# Patient Record
Sex: Female | Born: 1980 | Race: White | Hispanic: No | Smoking: Current every day smoker
Health system: Southern US, Community
[De-identification: ages and names within clinical notes are randomized; demographics above are authoritative.]

## PROBLEM LIST (undated history)

## (undated) DIAGNOSIS — F1910 Other psychoactive substance abuse, uncomplicated: Secondary | ICD-10-CM

## (undated) DIAGNOSIS — M5126 Other intervertebral disc displacement, lumbar region: Secondary | ICD-10-CM

---

## 1998-01-05 ENCOUNTER — Emergency Department (HOSPITAL_COMMUNITY): Admission: EM | Admit: 1998-01-05 | Discharge: 1998-01-05 | Payer: Self-pay | Admitting: Emergency Medicine

## 1998-01-05 ENCOUNTER — Encounter: Payer: Self-pay | Admitting: Emergency Medicine

## 1998-04-25 ENCOUNTER — Emergency Department (HOSPITAL_COMMUNITY): Admission: EM | Admit: 1998-04-25 | Discharge: 1998-04-25 | Payer: Self-pay

## 1998-06-01 ENCOUNTER — Emergency Department (HOSPITAL_COMMUNITY): Admission: EM | Admit: 1998-06-01 | Discharge: 1998-06-01 | Payer: Self-pay | Admitting: Emergency Medicine

## 1998-06-01 ENCOUNTER — Encounter: Payer: Self-pay | Admitting: Emergency Medicine

## 1998-07-31 ENCOUNTER — Emergency Department (HOSPITAL_COMMUNITY): Admission: EM | Admit: 1998-07-31 | Discharge: 1998-07-31 | Payer: Self-pay | Admitting: Emergency Medicine

## 1999-01-08 ENCOUNTER — Emergency Department (HOSPITAL_COMMUNITY): Admission: EM | Admit: 1999-01-08 | Discharge: 1999-01-08 | Payer: Self-pay | Admitting: Emergency Medicine

## 1999-03-30 ENCOUNTER — Emergency Department (HOSPITAL_COMMUNITY): Admission: EM | Admit: 1999-03-30 | Discharge: 1999-03-30 | Payer: Self-pay

## 1999-05-23 ENCOUNTER — Encounter: Payer: Self-pay | Admitting: Emergency Medicine

## 1999-05-23 ENCOUNTER — Emergency Department (HOSPITAL_COMMUNITY): Admission: EM | Admit: 1999-05-23 | Discharge: 1999-05-23 | Payer: Self-pay | Admitting: Emergency Medicine

## 1999-12-28 ENCOUNTER — Emergency Department (HOSPITAL_COMMUNITY): Admission: EM | Admit: 1999-12-28 | Discharge: 1999-12-28 | Payer: Self-pay | Admitting: *Deleted

## 2003-01-13 ENCOUNTER — Emergency Department (HOSPITAL_COMMUNITY): Admission: EM | Admit: 2003-01-13 | Discharge: 2003-01-14 | Payer: Self-pay | Admitting: Emergency Medicine

## 2004-03-02 ENCOUNTER — Inpatient Hospital Stay (HOSPITAL_COMMUNITY): Admission: AD | Admit: 2004-03-02 | Discharge: 2004-03-02 | Payer: Self-pay | Admitting: *Deleted

## 2004-03-30 ENCOUNTER — Ambulatory Visit (HOSPITAL_COMMUNITY): Admission: RE | Admit: 2004-03-30 | Discharge: 2004-03-30 | Payer: Self-pay | Admitting: *Deleted

## 2004-04-07 ENCOUNTER — Ambulatory Visit: Payer: Self-pay | Admitting: *Deleted

## 2004-04-21 ENCOUNTER — Ambulatory Visit: Payer: Self-pay | Admitting: *Deleted

## 2004-05-12 ENCOUNTER — Ambulatory Visit: Payer: Self-pay | Admitting: *Deleted

## 2004-05-19 ENCOUNTER — Ambulatory Visit: Payer: Self-pay | Admitting: *Deleted

## 2004-06-02 ENCOUNTER — Ambulatory Visit: Payer: Self-pay | Admitting: *Deleted

## 2004-06-03 ENCOUNTER — Ambulatory Visit: Payer: Self-pay | Admitting: *Deleted

## 2004-06-09 ENCOUNTER — Ambulatory Visit (HOSPITAL_COMMUNITY): Admission: RE | Admit: 2004-06-09 | Discharge: 2004-06-09 | Payer: Self-pay | Admitting: *Deleted

## 2004-06-09 ENCOUNTER — Ambulatory Visit: Payer: Self-pay | Admitting: Obstetrics & Gynecology

## 2004-06-16 ENCOUNTER — Ambulatory Visit: Payer: Self-pay | Admitting: Family Medicine

## 2004-06-19 ENCOUNTER — Inpatient Hospital Stay (HOSPITAL_COMMUNITY): Admission: AD | Admit: 2004-06-19 | Discharge: 2004-06-22 | Payer: Self-pay | Admitting: Obstetrics & Gynecology

## 2004-06-19 ENCOUNTER — Ambulatory Visit: Payer: Self-pay | Admitting: Obstetrics and Gynecology

## 2004-06-19 ENCOUNTER — Ambulatory Visit: Payer: Self-pay | Admitting: Internal Medicine

## 2004-06-21 ENCOUNTER — Encounter (INDEPENDENT_AMBULATORY_CARE_PROVIDER_SITE_OTHER): Payer: Self-pay | Admitting: *Deleted

## 2004-06-30 ENCOUNTER — Ambulatory Visit (HOSPITAL_COMMUNITY): Admission: RE | Admit: 2004-06-30 | Discharge: 2004-06-30 | Payer: Self-pay | Admitting: *Deleted

## 2004-06-30 ENCOUNTER — Ambulatory Visit: Payer: Self-pay | Admitting: Obstetrics & Gynecology

## 2004-07-08 ENCOUNTER — Ambulatory Visit: Payer: Self-pay | Admitting: *Deleted

## 2004-07-12 ENCOUNTER — Ambulatory Visit: Payer: Self-pay | Admitting: *Deleted

## 2004-07-14 ENCOUNTER — Inpatient Hospital Stay (HOSPITAL_COMMUNITY): Admission: AD | Admit: 2004-07-14 | Discharge: 2004-07-16 | Payer: Self-pay | Admitting: Obstetrics and Gynecology

## 2004-07-14 ENCOUNTER — Ambulatory Visit: Payer: Self-pay | Admitting: *Deleted

## 2004-07-16 ENCOUNTER — Ambulatory Visit: Payer: Self-pay | Admitting: *Deleted

## 2004-07-19 ENCOUNTER — Ambulatory Visit: Payer: Self-pay | Admitting: Obstetrics & Gynecology

## 2004-07-22 ENCOUNTER — Ambulatory Visit: Payer: Self-pay | Admitting: Family Medicine

## 2004-07-27 ENCOUNTER — Ambulatory Visit: Payer: Self-pay | Admitting: Obstetrics & Gynecology

## 2004-07-29 ENCOUNTER — Ambulatory Visit: Payer: Self-pay | Admitting: Family Medicine

## 2004-07-31 ENCOUNTER — Ambulatory Visit: Payer: Self-pay | Admitting: Obstetrics & Gynecology

## 2004-07-31 ENCOUNTER — Inpatient Hospital Stay (HOSPITAL_COMMUNITY): Admission: AD | Admit: 2004-07-31 | Discharge: 2004-08-03 | Payer: Self-pay | Admitting: Obstetrics & Gynecology

## 2004-08-01 ENCOUNTER — Encounter (INDEPENDENT_AMBULATORY_CARE_PROVIDER_SITE_OTHER): Payer: Self-pay | Admitting: *Deleted

## 2005-07-28 IMAGING — CR DG CHEST 2V
2 series · 2 of 2 positions shown · non-contrast
Comparison: 06/20/2004.

CLINICAL DATA: Pregnancy induced hypertension at 33 weeks of pregnancy. Smoker.

CHEST - 2 VIEW

[view not recorded (1 of 2)]
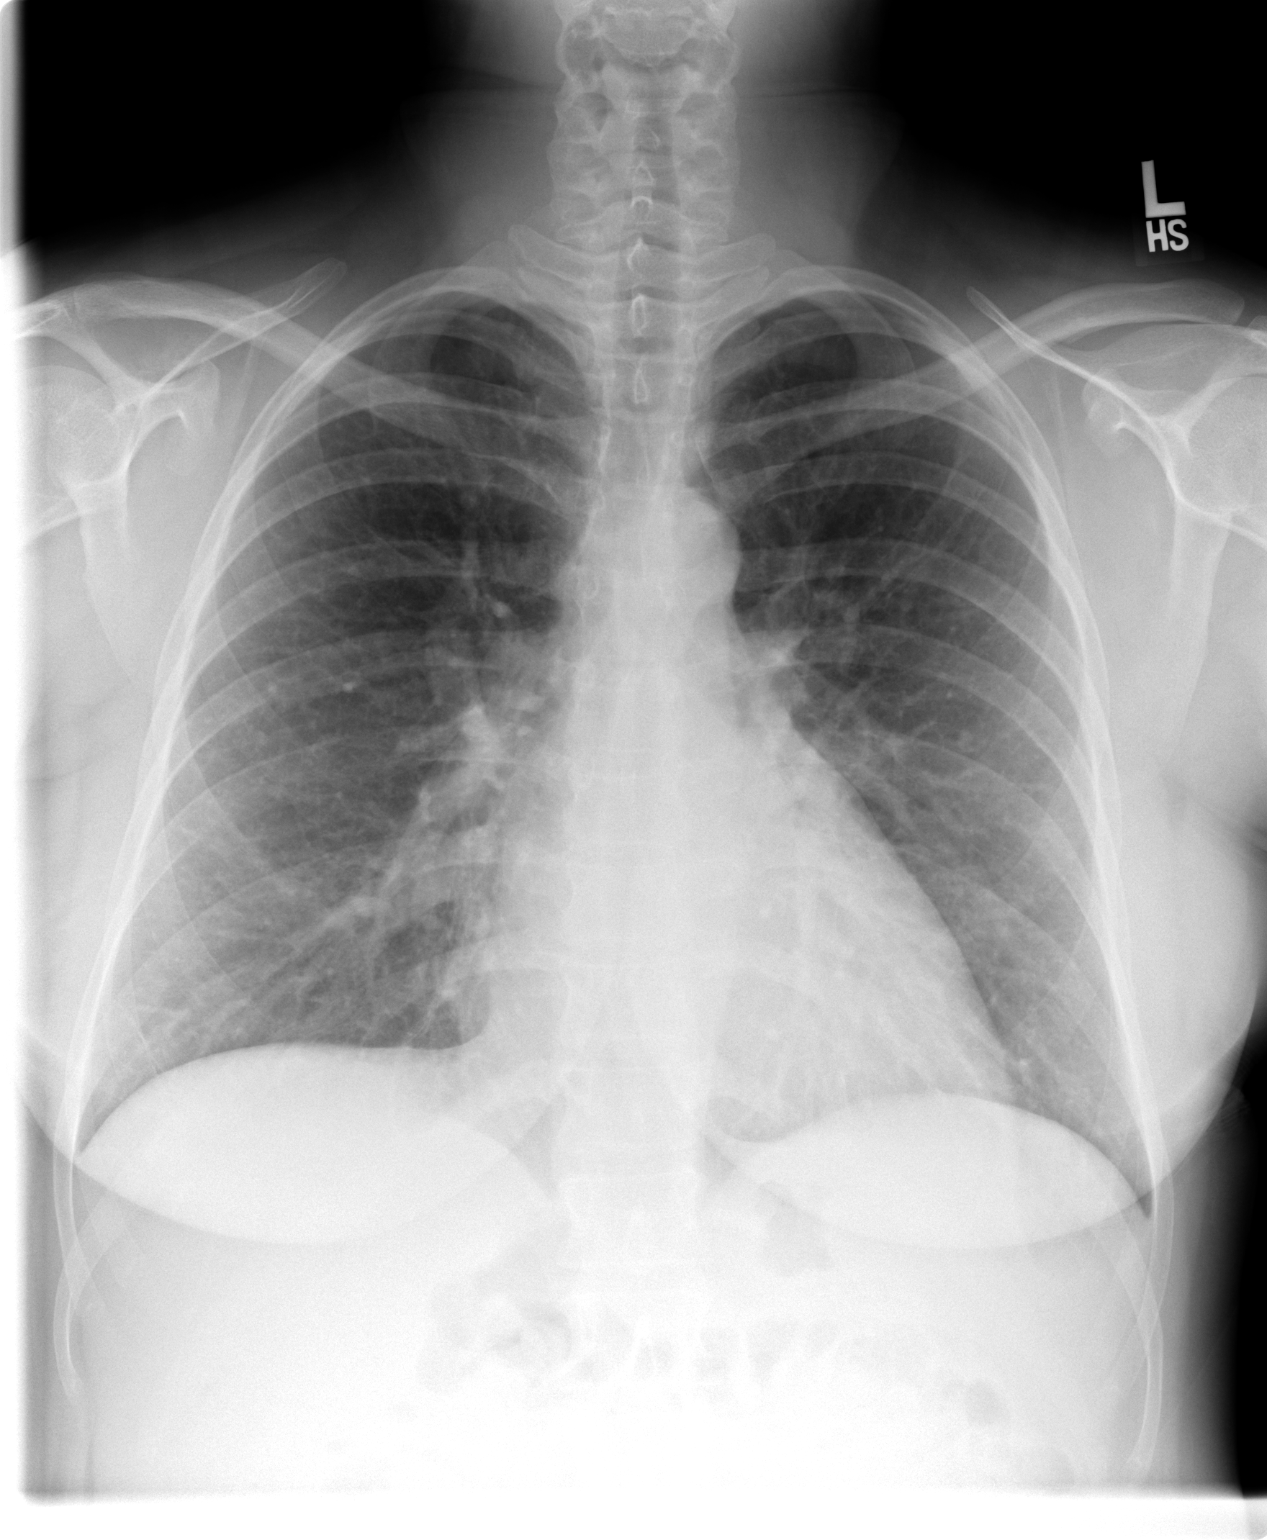

[view not recorded (2 of 2)]
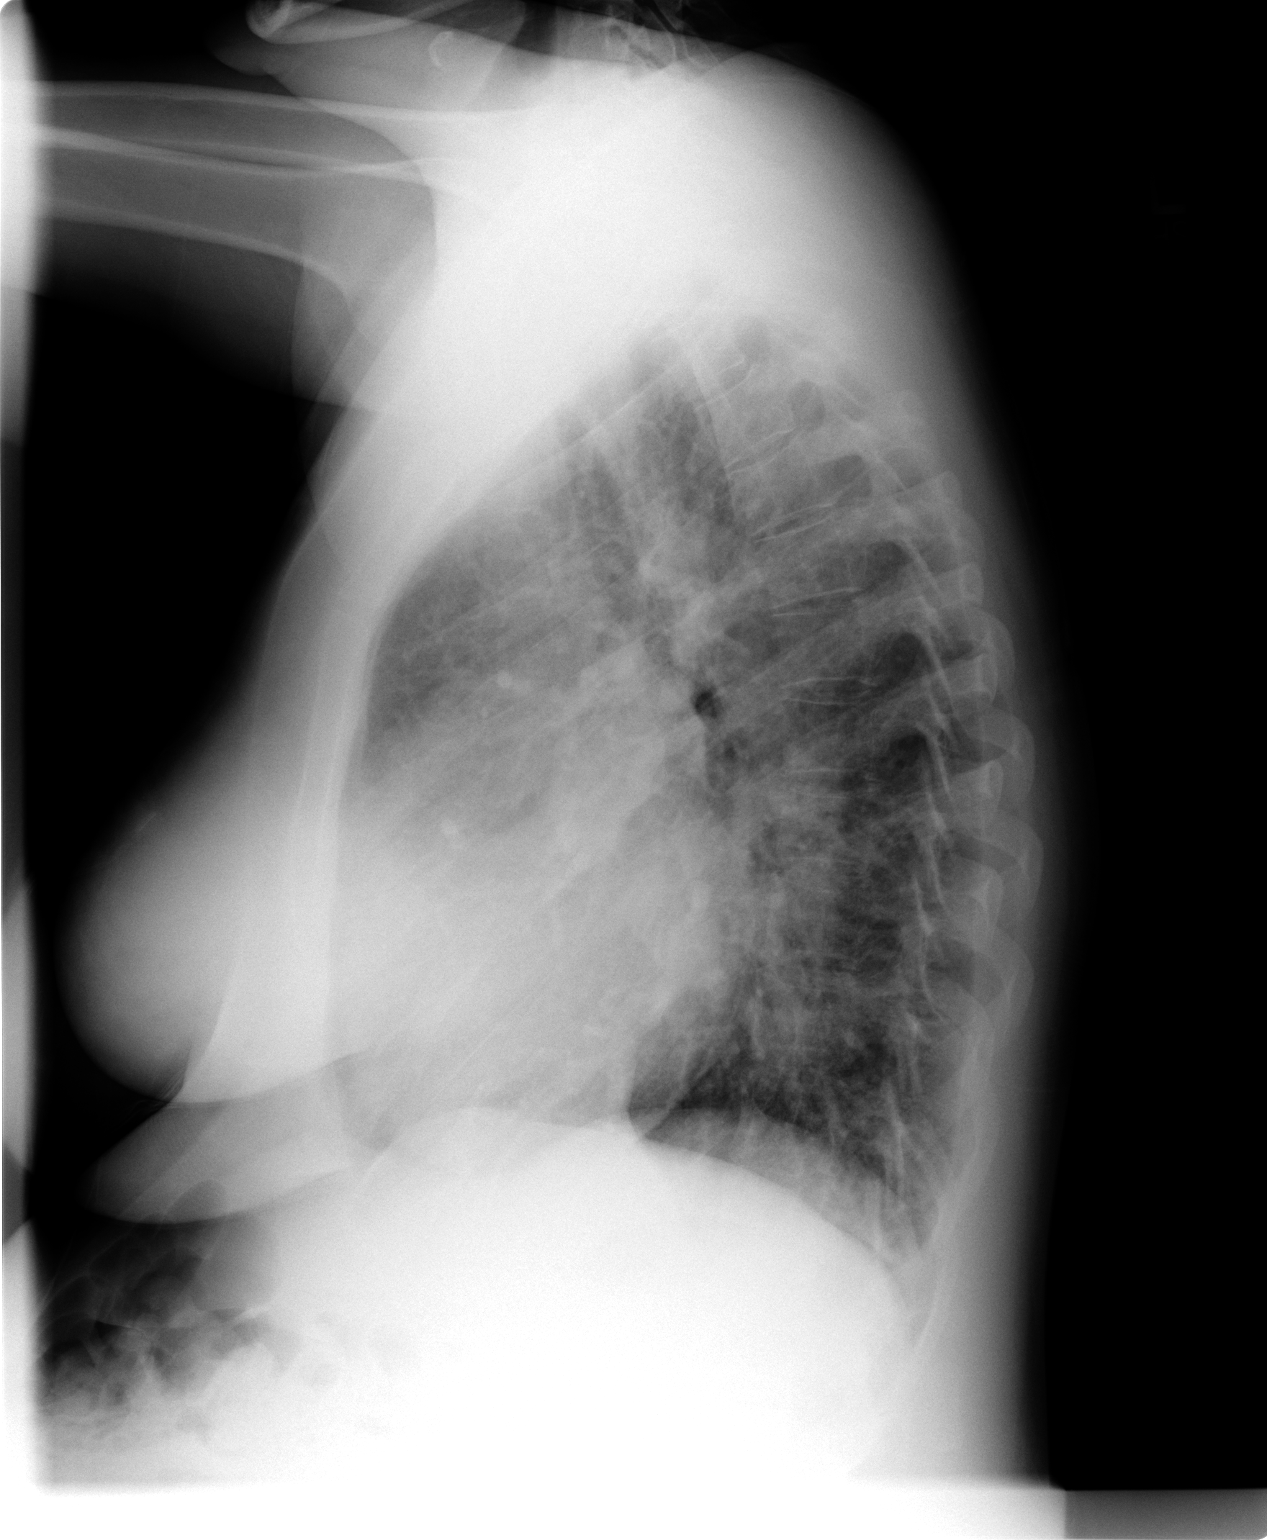

[2 of 2 positions shown; findings below may reference images not displayed]

FINDINGS: Borderline enlarged cardiac silhouette with an interval decrease in
size. Prominent pulmonary vasculature with improvement. Diffuse peribronchial
thickening and accentuation of the interstitial markings without significant
change. Interval minimal linear density at the right lung base. Minimal
scoliosis.

IMPRESSION

1. Borderline cardiomegaly and mild pulmonary vascular congestion with
improvement.

2. Stable mild chronic bronchitic changes and mild chronic interstitial lung
disease, compatible with the history of smoking.

3. Interval minimal linear atelectasis at the right lung base.

## 2005-07-31 ENCOUNTER — Emergency Department (HOSPITAL_COMMUNITY): Admission: EM | Admit: 2005-07-31 | Discharge: 2005-07-31 | Payer: Self-pay | Admitting: Emergency Medicine

## 2005-08-01 ENCOUNTER — Emergency Department (HOSPITAL_COMMUNITY): Admission: EM | Admit: 2005-08-01 | Discharge: 2005-08-01 | Payer: Self-pay | Admitting: Emergency Medicine

## 2005-10-09 ENCOUNTER — Emergency Department (HOSPITAL_COMMUNITY): Admission: EM | Admit: 2005-10-09 | Discharge: 2005-10-10 | Payer: Self-pay | Admitting: Emergency Medicine

## 2006-06-05 ENCOUNTER — Emergency Department (HOSPITAL_COMMUNITY): Admission: EM | Admit: 2006-06-05 | Discharge: 2006-06-05 | Payer: Self-pay | Admitting: Emergency Medicine

## 2006-08-21 ENCOUNTER — Encounter: Admission: RE | Admit: 2006-08-21 | Discharge: 2006-08-21 | Payer: Self-pay | Admitting: Family Medicine

## 2007-02-17 ENCOUNTER — Emergency Department (HOSPITAL_COMMUNITY): Admission: EM | Admit: 2007-02-17 | Discharge: 2007-02-17 | Payer: Self-pay | Admitting: Emergency Medicine

## 2007-03-02 ENCOUNTER — Emergency Department (HOSPITAL_COMMUNITY): Admission: EM | Admit: 2007-03-02 | Discharge: 2007-03-02 | Payer: Self-pay | Admitting: Emergency Medicine

## 2007-07-22 ENCOUNTER — Emergency Department (HOSPITAL_COMMUNITY): Admission: EM | Admit: 2007-07-22 | Discharge: 2007-07-23 | Payer: Self-pay | Admitting: Emergency Medicine

## 2008-10-21 ENCOUNTER — Encounter: Payer: Self-pay | Admitting: Cardiology

## 2010-03-21 ENCOUNTER — Encounter: Payer: Self-pay | Admitting: *Deleted

## 2010-05-31 ENCOUNTER — Emergency Department (HOSPITAL_COMMUNITY)
Admission: EM | Admit: 2010-05-31 | Discharge: 2010-06-01 | Disposition: A | Discharge status: Home / Self Care | Payer: Self-pay | Attending: Emergency Medicine | Admitting: Emergency Medicine

## 2010-05-31 DIAGNOSIS — M549 Dorsalgia, unspecified: Secondary | ICD-10-CM | POA: Insufficient documentation

## 2010-05-31 DIAGNOSIS — E876 Hypokalemia: Secondary | ICD-10-CM | POA: Insufficient documentation

## 2010-05-31 DIAGNOSIS — Z79899 Other long term (current) drug therapy: Secondary | ICD-10-CM | POA: Insufficient documentation

## 2010-05-31 DIAGNOSIS — F319 Bipolar disorder, unspecified: Secondary | ICD-10-CM | POA: Insufficient documentation

## 2010-05-31 DIAGNOSIS — G8929 Other chronic pain: Secondary | ICD-10-CM | POA: Insufficient documentation

## 2010-05-31 DIAGNOSIS — F1310 Sedative, hypnotic or anxiolytic abuse, uncomplicated: Secondary | ICD-10-CM | POA: Insufficient documentation

## 2010-05-31 DIAGNOSIS — J45909 Unspecified asthma, uncomplicated: Secondary | ICD-10-CM | POA: Insufficient documentation

## 2010-05-31 LAB — BASIC METABOLIC PANEL
BUN: 12 mg/dL (ref 6–23)
CO2: 27 mEq/L (ref 19–32)
Calcium: 8.6 mg/dL (ref 8.4–10.5)
Chloride: 107 mEq/L (ref 96–112)
Creatinine, Ser: 0.78 mg/dL (ref 0.4–1.2)
GFR calc Af Amer: >60 mL/min (ref >60)
GFR calc non Af Amer: >60 mL/min (ref >60)
Glucose, Bld: 93 mg/dL (ref 70–99)
Potassium: 2.9 mEq/L — ABNORMAL LOW (ref 3.5–5.1)
Sodium: 141 mEq/L (ref 135–145)

## 2010-05-31 LAB — DIFFERENTIAL
Basophils Relative: 0 % (ref 0–1)
Eosinophils Relative: 2 % (ref 0–5)
Lymphocytes Relative: 37 % (ref 12–46)
Monocytes Absolute: 0.4 10*3/uL (ref 0.1–1.0)
Monocytes Relative: 5 % (ref 3–12)

## 2010-05-31 LAB — CBC
MCH: 29.3 pg (ref 26.0–34.0)
MCHC: 33.8 g/dL (ref 30.0–36.0)
RBC: 3.99 MIL/uL (ref 3.87–5.11)
RDW: 13.2 % (ref 11.5–15.5)

## 2010-05-31 LAB — PREGNANCY, URINE: Preg Test, Ur: NEGATIVE

## 2010-05-31 LAB — ETHANOL: Alcohol, Ethyl (B): <5 mg/dL (ref 0–10)

## 2010-05-31 LAB — URINALYSIS, ROUTINE W REFLEX MICROSCOPIC
Bilirubin Urine: NEGATIVE
Glucose, UA: NEGATIVE mg/dL
Hgb urine dipstick: NEGATIVE
Ketones, ur: NEGATIVE mg/dL
Nitrite: NEGATIVE
Protein, ur: NEGATIVE mg/dL
Specific Gravity, Urine: 1.009 (ref 1.005–1.030)
Urobilinogen, UA: 0.2 mg/dL (ref 0.0–1.0)
pH: 6.5 (ref 5.0–8.0)

## 2010-05-31 LAB — RAPID URINE DRUG SCREEN, HOSP PERFORMED
Amphetamines: NOT DETECTED
Opiates: NOT DETECTED

## 2010-06-02 ENCOUNTER — Emergency Department (HOSPITAL_COMMUNITY)
Admission: EM | Admit: 2010-06-02 | Discharge: 2010-06-02 | Disposition: A | Discharge status: Home / Self Care | Payer: Self-pay | Attending: Emergency Medicine | Admitting: Emergency Medicine

## 2010-06-02 DIAGNOSIS — F411 Generalized anxiety disorder: Secondary | ICD-10-CM | POA: Insufficient documentation

## 2010-06-02 DIAGNOSIS — J45909 Unspecified asthma, uncomplicated: Secondary | ICD-10-CM | POA: Insufficient documentation

## 2010-06-02 DIAGNOSIS — F319 Bipolar disorder, unspecified: Secondary | ICD-10-CM | POA: Insufficient documentation

## 2010-06-02 DIAGNOSIS — F1310 Sedative, hypnotic or anxiolytic abuse, uncomplicated: Secondary | ICD-10-CM | POA: Insufficient documentation
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
## 2010-07-16 NOTE — Discharge Summary (Signed)
NAMEMARGAUX, [name]              ACCOUNT NO.:  [id number]   MEDICAL RECORD NO.:  [id number]          PATIENT TYPE:  INP   LOCATION:  9104                          [facility]ILITY[facility]   PHYSICIAN:  Lesly Dukes, M.D. DATE OF BIRTH:  28-May-1980   DATE OF ADMISSION:  07/31/2004  DATE OF DISCHARGE:  08/03/2004                                 DISCHARGE SUMMARY   ADMISSION DIAGNOSES:  66.  A 30 year old, gravida 3, para 1-1-0-2, at 35-5/7 week.  2.  Vaginal bleeding.   DISCHARGE DIAGNOSES:  42.  A 30 year old, gravida 3, para 1-2-0-2, status post low transverse      cesarean section.  2.  Bilateral tubal ligation.   HISTORY OF PRESENT ILLNESS:  [name] is a 30 year old, gravida 3,  para 1-1-0-2 that presented to the MAU at 35 weeks and five days complaining  of vaginal bleeding and questionable leakage of fluid.  She is a patient at  high-risk clinic secondary to her pregnancy-induced hypertension.  At  admission, the patient was on labetalol 200 mg b.i.d.  At admission, blood  pressure was found to be 182/128.  Cervical exam 1 cm dilated, 80% effaced,  high position as reported by Dr. Elsie Lincoln.  The patient was admitted  for possible placenta abruption.   HOSPITAL COURSE:  The patient was immediately started on magnesium sulfate  for pregnancy-induced hypertension.  The patient's blood pressure remained  in the 170's over 120's.  Labetalol IV 10 mg did bring the patient's blood  pressure down to 151/90.  The patient did have spontaneous rupture of  membranes which was bloody.  At that point it was further evaluated that the  patient was probably having abruption.  The patient requested cesarean  section and was taken back for emergency low transverse cesarean section.  The patient tolerated the surgical procedure well without any complications.  However, postoperatively the patient had slightly decreased urinary output.  She was given bolus of 500 mL and responded well.   The patient's blood  pressure was poorly controlled after delivery of on day #3.  She was started  on hydrochlorothiazide 25 mg as well as labetalol 300 mg b.i.d. with good  blood pressure control.  The patient was discharged home in stable and  improved condition with scheduled nursing home visits for blood pressure  check and she will follow up in the clinic in two weeks after delivery for  blood pressure check.   DISCHARGE MEDICATIONS:  1.  Ibuprofen 800 mg every six hours p.r.n. pain.  2.  Iron sulfate 325 mg daily.  3.  Colace 100 mg daily.  4.  Hydrochlorothiazide 25 mg daily.  5.  Labetalol 300 mg one tablet p.o. b.i.d.   CONDITION ON DISCHARGE:  Stable and improved.   FOLLOW UP:  The patient will be followed by [facility].  First appointment Monday at 8 a.m. for blood pressure check.      Bonnita Hollow, M.D.    ______________________________  Lesly Dukes, M.D.    VRE/MEDQ  D:  10/11/2004  T:  10/12/2004  Job:  [id number]
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
## 2010-07-16 NOTE — Consult Note (Signed)
[name], [name]              ACCOUNT NO.:  [id number]   MEDICAL RECORD NO.:  [id number]          PATIENT TYPE:  INP   LOCATION:  9157                          [facility]   PHYSICIAN:  Arvilla Meres, M.D. LHCDATE OF BIRTH:  30-Dec-1980   DATE OF CONSULTATION:  DATE OF DISCHARGE:                                   CONSULTATION   ADDENDUM:  I have reviewed [name] echocardiogram.  I believe that her  left ventricular ejection fraction is essentially normal to low normal with  an EF of 50-55%.  There is evidence of mild mitral regurgitation with no  evidence of mitral valve prolapse.   Thus, I do not think that she has a cardiomyopathy.  I will review her  echocardiogram with one of my echo partners in the morning to confirm this.  So at this point I would continue to just treat her hypertension and  consider endocarditis prophylaxis prior to delivery.      DB/MEDQ  D:  06/22/2004  T:  06/22/2004  Job:  [id number]
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
## 2010-07-16 NOTE — Discharge Summary (Signed)
[name], [name]              ACCOUNT NO.:  [id number]   MEDICAL RECORD NO.:  [id number]          PATIENT TYPE:  WOC   LOCATION:  WOC                          FACILITY:  [facility]   PHYSICIAN:  Jon Gills, M.D.  DATE OF BIRTH:  Nov 16, 1980   DATE OF ADMISSION:  06/16/2004  DATE OF DISCHARGE:  06/22/2004                                 DISCHARGE SUMMARY   ADMISSION DIAGNOSES:  48.  A 30 year old G3, P1-1-0-2, at 80 and 4 with dyspnea and cough.  2.  Hypokalemia.  3.  Polysubstance abuse.  4.  Chronic hypertension.   DISCHARGE DIAGNOSES:  65.  A 30 year old G3, P1-1-0-2, at 61 and 1 with acute bronchitis.  2.  Chronic hypertension.  3.  Hypokalemia.  4.  Polysubstance abuse.   DISCHARGE MEDICATIONS:  1.  Hydralazine 10 mg p.o. t.i.d.  2.  Zithromax 250 mg q.d. for entire Z-Pak.  3.  Prenatal vitamin 1 p.o. daily.  4.  Combivent 2 puffs t.i.d.   ADMISSION HISTORY:  Ms. Philipson was admitted from high risk clinic for dyspnea  and cough.   HOSPITAL COURSE:  1.  Bronchitis:  A chest x-ray done on April 23, showed right lower lobe      atelectasis versus early pneumonia and mild cardiac enlargement.  The      patient was started on albuterol nebs, Rocephin, and Zithromax.  She had      no fevers throughout her hospitalization.  She was never oxygen      requiring and maintained normal oxygen saturations.  She continued to go      out to smoke multiple times a day.  Her shortness of breath and cough      resolved by day of discharge.   1.  Hypokalemia:  She presented with a potassium of 2.8.  This was corrected      throughout her hospitalization.   1.  Chronic hypertension:  The patient's blood pressure was mildly elevated      in the 150s/90s to 160s/100s.  Due to the mild cardiac enlargement on      chest x-ray, cardiology consult was performed.  An echocardiogram was      done which showed an EF of 50-55% with no cardiac enlargement.  An EKG      showed normal sinus rhythm  with possible left atrial enlargement.  A BNP      was insignificant at 195.  Her drug screen during this hospitalization      was normal.  Her PIH labs were normal, and a 24 hour urine was      significant with 396 mg of protein.   1.  Fetal heart tracing remained reassuring throughout her hospitalization.   1.  Polysubstance abuse:  She denies any cocaine currently.  She does smoke      marijuana.  She also smokes tobacco and would leave the hospital      multiple times a day to smoke.  She was started on Combivent during this      hospitalization to help with the dyspnea and suspected bronchitis  secondary to her smoking.   CONDITION ON DISCHARGE:  The patient was discharged home in stable  condition.   The patient is to follow up with cardiology as well as high risk clinic.  She had been given discharge instructions regarding preterm labor.      LC/MEDQ  D:  06/22/2004  T:  06/22/2004  Job:  [id number]

## 2010-07-16 NOTE — Op Note (Signed)
NAME:  [name], [name] NO.:  [id number]   MEDICAL RECORD NO.:  [id number]           PATIENT TYPE:   LOCATION:                                 FACILITY:   PHYSICIAN:  Lesly Dukes, M.D.      DATE OF BIRTH:   DATE OF PROCEDURE:  DATE OF DISCHARGE:                                 OPERATIVE REPORT   PREOPERATIVE DIAGNOSES:  The patient is a 30 year old gravida 3, para 1-1-0-  1, at 35 weeks and five days estimated gestational age with:  1.  Placental abruption.  2.  Severe preeclampsia.  3.  Multiparity; desires sterility.   POSTOPERATIVE DIAGNOSES:  The patient is a 30 year old gravida 3, para 1-1-0-  1, at 35 weeks and five days estimated gestational age with:  1.  Placental abruption.  2.  Severe preeclampsia.  3.  Multiparity; desires sterility.   OPERATION/PROCEDURE:  Repeat low flap transverse cesarean section with  bilateral tubal ligation.   SURGEON:  Lesly Dukes, M.D.   ASSISTANT:  Jeb Levering, M.D.   ANESTHESIA:  Spinal.   SPECIMENS:  Placenta.   ESTIMATED BLOOD LOSS:  800 mL from cesarean section; 400 mL clot which was  discovered on incision.   COMPLICATIONS:  None.   FINDINGS:  Viable female infant, vertex, bloody fluid.  Cord PH 7.33 of the  umbilical artery.  Grossly normal uterus, ovaries and fallopian tubes.   DESCRIPTION OF PROCEDURE:  After informed consent was obtained, the patient  was taken to the operating room where spinal anesthesia was found to be  adequate.  The patient was placed in the dorsal supine position with a  leftward tilt.  The Foley was already in the bladder.  The abdomen was  prepped and draped in the normal sterile fashion.   A Pfannenstiel skin incision was made with the scalpel.  This incision  carried down to the layer of fascia.  The fascia was incised in the midline  and this incision was extended bilaterally with the Mayo scissors.  The  superior and inferior aspects of the fascial incision  were grasped with  Kocher clamps, tented up, dissected off sharply and bluntly from underlying  layers of rectus muscles.  The rectus muscles were separated in the midline.  The peritoneum was entered bluntly, extended both superior and inferiorly  with good visualization of the bladder.  The bladder blade was inserted.  The vesicouterine peritoneum was identified, tented up and entered sharply  with the Metzenbaum scissors.  This incision was extended bilaterally and  the bladder flap was created digitally.  Uterine incision was made in the  transverse fashion in the lower uterine segment.  This incision was extended  bilaterally with the bandage scissors.  The baby's head delivered easily and  the nose and mouth were suctioned.  The rest of the baby's body delivered  easily.  We auto infused the infant with cord blood.  The cord was clamped  and cut and the baby was handed off to the waiting pediatrician.  Cord blood  was sent for type and screen and  a cord gas was also sent with the above  results.  Placenta delivered spontaneous with three-vessel cord and the  uterus was cleared of all clots and debris.   The uterine incision was closed with 0 Vicryl in a running locked fashion.  __________  was used to insure hemostasis.   Each fallopian tube was then identified and followed out to its fimbriated  end.  Middle portion was completely clamped with Filshie clips.  This was  done on each fallopian tube.  There was good hemostasis.   The uterus was returned to the abdomen and the uterus was noted to be  hemostatic off tension.  The rectus muscles  and peritoneum and bladder flap  were all noted to be hemostatic.  The fascia was closed with 0 Vicryl in a  running  fashion.  Good hemostasis was noted.  The subcutaneous tissue was  closed copiously irrigated and noted to be hemostatic.  The skin was closed  with staples.  The patient tolerated the procedure well.  Sponge, lab,  instrument,  and needle counts were x2.  The patient went to the recovery  room in stable condition.      KHL/MEDQ  D:  07/31/2004  T:  08/01/2004  Job:  [id number]

## 2010-07-21 ENCOUNTER — Emergency Department (HOSPITAL_COMMUNITY)
Admission: EM | Admit: 2010-07-21 | Discharge: 2010-07-22 | Disposition: A | Discharge status: Other Acute Inpatient Hospital | Payer: Self-pay | Attending: Emergency Medicine | Admitting: Emergency Medicine

## 2010-07-21 DIAGNOSIS — F341 Dysthymic disorder: Secondary | ICD-10-CM | POA: Insufficient documentation

## 2010-07-21 DIAGNOSIS — J45909 Unspecified asthma, uncomplicated: Secondary | ICD-10-CM | POA: Insufficient documentation

## 2010-07-21 DIAGNOSIS — F1310 Sedative, hypnotic or anxiolytic abuse, uncomplicated: Secondary | ICD-10-CM | POA: Insufficient documentation

## 2010-07-21 DIAGNOSIS — G8929 Other chronic pain: Secondary | ICD-10-CM | POA: Insufficient documentation

## 2010-07-21 DIAGNOSIS — M549 Dorsalgia, unspecified: Secondary | ICD-10-CM | POA: Insufficient documentation

## 2010-07-21 DIAGNOSIS — F319 Bipolar disorder, unspecified: Secondary | ICD-10-CM | POA: Insufficient documentation

## 2010-07-21 LAB — DIFFERENTIAL
Basophils Relative: 0 % (ref 0–1)
Eosinophils Relative: 1 % (ref 0–5)
Lymphocytes Relative: 35 % (ref 12–46)
Monocytes Absolute: 0.4 10*3/uL (ref 0.1–1.0)
Monocytes Relative: 5 % (ref 3–12)
Neutro Abs: 4.6 10*3/uL (ref 1.7–7.7)

## 2010-07-21 LAB — CBC
HCT: 37.1 % (ref 36.0–46.0)
Hemoglobin: 12.8 g/dL (ref 12.0–15.0)
MCH: 30.4 pg (ref 26.0–34.0)
MCHC: 34.5 g/dL (ref 30.0–36.0)
RDW: 12.7 % (ref 11.5–15.5)

## 2010-07-21 LAB — RAPID URINE DRUG SCREEN, HOSP PERFORMED
Cocaine: NOT DETECTED
Opiates: NOT DETECTED
Tetrahydrocannabinol: POSITIVE — AB

## 2010-07-21 LAB — COMPREHENSIVE METABOLIC PANEL
BUN: 8 mg/dL (ref 6–23)
CO2: 27 mEq/L (ref 19–32)
Calcium: 9.8 mg/dL (ref 8.4–10.5)
Creatinine, Ser: 1 mg/dL (ref 0.4–1.2)
GFR calc non Af Amer: >60 mL/min (ref >60)
Glucose, Bld: 86 mg/dL (ref 70–99)
Total Protein: 7 g/dL (ref 6.0–8.3)

## 2010-07-21 LAB — ETHANOL: Alcohol, Ethyl (B): <11 mg/dL — ABNORMAL HIGH (ref 0–10)

## 2010-12-03 LAB — RAPID STREP SCREEN (MED CTR MEBANE ONLY): Streptococcus, Group A Screen (Direct): NEGATIVE

## 2012-06-08 ENCOUNTER — Encounter (HOSPITAL_COMMUNITY): Payer: Self-pay | Admitting: Emergency Medicine

## 2012-06-08 ENCOUNTER — Emergency Department (HOSPITAL_COMMUNITY)
Admission: EM | Admit: 2012-06-08 | Discharge: 2012-06-08 | Disposition: A | Discharge status: Home / Self Care | Payer: Self-pay | Attending: Emergency Medicine | Admitting: Emergency Medicine

## 2012-06-08 DIAGNOSIS — Z79899 Other long term (current) drug therapy: Secondary | ICD-10-CM | POA: Insufficient documentation

## 2012-06-08 DIAGNOSIS — F17200 Nicotine dependence, unspecified, uncomplicated: Secondary | ICD-10-CM | POA: Insufficient documentation

## 2012-06-08 DIAGNOSIS — M25519 Pain in unspecified shoulder: Secondary | ICD-10-CM | POA: Insufficient documentation

## 2012-06-08 DIAGNOSIS — Z8739 Personal history of other diseases of the musculoskeletal system and connective tissue: Secondary | ICD-10-CM | POA: Insufficient documentation

## 2012-06-08 DIAGNOSIS — M545 Low back pain, unspecified: Secondary | ICD-10-CM | POA: Insufficient documentation

## 2012-06-08 HISTORY — DX: Other intervertebral disc displacement, lumbar region: M51.26

## 2012-06-08 MED ORDER — HYDROCODONE-ACETAMINOPHEN 5-325 MG PO TABS: 1.0000 | ORAL_TABLET | Freq: Four times a day (QID) | ORAL | Status: DC | PRN

## 2012-06-08 NOTE — ED Provider Notes (Signed)
History    This chart was scribed for non-physician practitioner working with Alicia Lennert, MD by [name], ED Scribe. This patient was seen in room WTR5/WTR5 and the patient's care was started at 1832.   CSN: [id number]  Arrival date & time 06/08/12  1713   First MD Initiated Contact with Patient 06/08/12 1832      Chief Complaint  Patient presents with  . Back Pain    (Consider location/radiation/quality/duration/timing/severity/associated sxs/prior treatment) The history is provided by the patient and medical records. No language interpreter was used.    [name] is a 32 y.o. female with a h/o of a herniated lumbar disc, who presents to the Emergency Department complaining of gradually worsening, constant, lower back pain with associated right shoulder pain that is aggravated by standing and movement that began 2 months ago. She denies any urinary or fecal incontinence. She states that she has treated the pain with [facility], tylenol, a heating pad, and an old Vicodin prescription. She states that she works at [employer] and is required to stand for long periods of time for work.  No PCP. 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  Past Medical History  Diagnosis Date  . Herniated lumbar disc without myelopathy     History reviewed. No pertinent past surgical history.  History reviewed. No pertinent family history.  History  Substance Use Topics  . Smoking status: Current Every Day Smoker -- 1.00 packs/day    Types: Cigarettes  . Smokeless tobacco: Not on file  . Alcohol Use: Yes    OB History   Grav Para Term Preterm Abortions TAB SAB Ect Mult Living                  Review of Systems  Genitourinary:       Denies fecal and urinary incontinence.  Musculoskeletal: Positive for back pain.  All other systems reviewed and are negative.    Allergies  Doxycycline  Home Medications   Current Outpatient Rx  Name  Route  Sig  Dispense  Refill  . ALPRAZolam (XANAX)  1 MG tablet   Oral   Take 1 mg by mouth 3 (three) times daily as needed for anxiety.         Marland Kitchen ibuprofen (ADVIL,MOTRIN) 200 MG tablet   Oral   Take 400 mg by mouth every 6 (six) hours as needed for pain.         Marland Kitchen HYDROcodone-acetaminophen (NORCO/VICODIN) 5-325 MG per tablet   Oral   Take 1 tablet by mouth every 6 (six) hours as needed for pain.   10 tablet   0     BP 122/85  Pulse 92  Temp(Src) 98.3 F (36.8 C) (Oral)  Resp 20  SpO2 97%  LMP 05/29/2012  Physical Exam  Nursing note and vitals reviewed. Constitutional: She is oriented to person, place, and time. She appears well-developed and well-nourished. No distress.  HENT:  Head: Normocephalic and atraumatic.  Eyes: EOM are normal. Pupils are equal, round, and reactive to light.  Neck: Normal range of motion. Neck supple. No tracheal deviation present.  Cardiovascular: Normal rate.   Pulmonary/Chest: Effort normal. No respiratory distress.  Abdominal: Soft. She exhibits no distension.  Musculoskeletal: Normal range of motion. She exhibits no edema.       Back:   Equal strength to bilateral lower extremities. Neurosensory  function adequate to both legs. Skin color is normal. Skin is warm and moist. I see no step off deformity, no  bony tenderness. Pt is able to ambulate without limp. Pain is relieved when sitting in certain positions. ROM is decreased due to pain. No crepitus, laceration, effusion, swelling.  Pulses are normal   Neurological: She is alert and oriented to person, place, and time.  Skin: Skin is warm and dry.  Psychiatric: She has a normal mood and affect. Her behavior is normal.    ED Course  Procedures (including critical care time)  DIAGNOSTIC STUDIES: Oxygen Saturation is 97% on room air, adequate by my interpretation.    COORDINATION OF CARE:  18:58- Discussed planned course of treatment with the patient, including Vicodin and an orthopedist consult, who is agreeable at this  time.  Labs Reviewed - No data to display No results found.   1. Low back pain       MDM  Patient with back pain. No neurological deficits. Patient is ambulatory. No warning symptoms of back pain including: loss of bowel or bladder control, night sweats, waking from sleep with back pain, unexplained fevers or weight loss, h/o cancer, IVDU, recent trauma. No concern for cauda equina, epidural abscess, or other serious cause of back pain. Conservative measures such as rest, ice/heat and pain medicine indicated with PCP follow-up if no improvement with conservative management.         Dorthula Matas, PA-C 06/08/12 1907

## 2012-06-08 NOTE — ED Provider Notes (Signed)
Medical screening examination/treatment/procedure(s) were performed by non-physician practitioner and as supervising physician I was immediately available for consultation/collaboration.   Bird Tailor L Oviya Ammar, MD 06/08/12 2056 

## 2012-06-08 NOTE — ED Notes (Addendum)
Patient has herniated L4/L5 that was never repaired.  Patient reports lower back pain x 2 months.  No injury reported.

## 2012-06-08 NOTE — ED Notes (Signed)
Pt ambulatory to exam room with steady gait. Pt states she has a ride home.

## 2012-08-18 ENCOUNTER — Encounter (HOSPITAL_COMMUNITY): Payer: Self-pay | Admitting: Adult Health

## 2012-08-18 ENCOUNTER — Emergency Department (HOSPITAL_COMMUNITY)
Admission: EM | Admit: 2012-08-18 | Discharge: 2012-08-18 | Disposition: A | Discharge status: Home / Self Care | Payer: Self-pay | Attending: Emergency Medicine | Admitting: Emergency Medicine

## 2012-08-18 DIAGNOSIS — R51 Headache: Secondary | ICD-10-CM | POA: Insufficient documentation

## 2012-08-18 DIAGNOSIS — F17200 Nicotine dependence, unspecified, uncomplicated: Secondary | ICD-10-CM | POA: Insufficient documentation

## 2012-08-18 DIAGNOSIS — Z8739 Personal history of other diseases of the musculoskeletal system and connective tissue: Secondary | ICD-10-CM | POA: Insufficient documentation

## 2012-08-18 DIAGNOSIS — F1210 Cannabis abuse, uncomplicated: Secondary | ICD-10-CM | POA: Insufficient documentation

## 2012-08-18 DIAGNOSIS — Z3202 Encounter for pregnancy test, result negative: Secondary | ICD-10-CM | POA: Insufficient documentation

## 2012-08-18 DIAGNOSIS — R110 Nausea: Secondary | ICD-10-CM | POA: Insufficient documentation

## 2012-08-18 DIAGNOSIS — F1310 Sedative, hypnotic or anxiolytic abuse, uncomplicated: Secondary | ICD-10-CM | POA: Insufficient documentation

## 2012-08-18 DIAGNOSIS — R61 Generalized hyperhidrosis: Secondary | ICD-10-CM | POA: Insufficient documentation

## 2012-08-18 DIAGNOSIS — F1910 Other psychoactive substance abuse, uncomplicated: Secondary | ICD-10-CM

## 2012-08-18 DIAGNOSIS — R109 Unspecified abdominal pain: Secondary | ICD-10-CM | POA: Insufficient documentation

## 2012-08-18 LAB — RAPID URINE DRUG SCREEN, HOSP PERFORMED: Opiates: NOT DETECTED

## 2012-08-18 LAB — SALICYLATE LEVEL: Salicylate Lvl: <2 mg/dL — ABNORMAL LOW (ref 2.8–20.0)

## 2012-08-18 LAB — COMPREHENSIVE METABOLIC PANEL
AST: 15 U/L (ref 0–37)
CO2: 24 mEq/L (ref 19–32)
Calcium: 9.4 mg/dL (ref 8.4–10.5)
Creatinine, Ser: 0.92 mg/dL (ref 0.50–1.10)
GFR calc Af Amer: >90 mL/min (ref >90)
GFR calc non Af Amer: 80 mL/min — ABNORMAL LOW (ref >90)
Glucose, Bld: 141 mg/dL — ABNORMAL HIGH (ref 70–99)

## 2012-08-18 LAB — CBC
MCH: 29.2 pg (ref 26.0–34.0)
MCV: 85.7 fL (ref 78.0–100.0)
Platelets: 223 10*3/uL (ref 150–400)
RBC: 4.49 MIL/uL (ref 3.87–5.11)

## 2012-08-18 LAB — ETHANOL: Alcohol, Ethyl (B): <11 mg/dL (ref 0–11)

## 2012-08-18 MED ORDER — POTASSIUM CHLORIDE CRYS ER 20 MEQ PO TBCR
40.0000 meq | EXTENDED_RELEASE_TABLET | Freq: Once | ORAL | Status: AC
  Administered 2012-08-18: 40 meq via ORAL
  Filled 2012-08-18: qty 2

## 2012-08-18 NOTE — ED Provider Notes (Signed)
History     CSN: [id number]  Arrival date & time 08/18/12  2045   First MD Initiated Contact with Patient 08/18/12 2116      Chief Complaint  Patient presents with  . Medical Clearance   HPI  History provided by the patient. The patient is a 32 year old female presenting for medical clearance. 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 She is trying to get help and [facility]. Patient is excepted back if medically cleared. Her last Xanax use was over 3 days ago. Prior to that she had been trying to wean herself off by taking less per day. She was only taking 2, 1 mg tablets a day before stopping. Patient has reported some abdominal discomfort some slight nausea since stopping hydrocodone and Xanax. She doesn't to using marijuana which has helped with some of her symptoms. She denies any other drug use. Denies any alcohol use. She denies any significant depression, SI or HI. She has no other complaints.    Past Medical History  Diagnosis Date  . Herniated lumbar disc without myelopathy     History reviewed. No pertinent past surgical history.  History reviewed. No pertinent family history.  History  Substance Use Topics  . Smoking status: Current Every Day Smoker -- 1.00 packs/day    Types: Cigarettes  . Smokeless tobacco: Not on file  . Alcohol Use: Yes    OB History   Grav Para Term Preterm Abortions TAB SAB Ect Mult Living                  Review of Systems  Constitutional: Positive for diaphoresis. Negative for fever and chills.  Respiratory: Negative for shortness of breath.   Cardiovascular: Negative for chest pain.  Gastrointestinal: Positive for nausea.  Neurological: Positive for headaches. Negative for dizziness, weakness, light-headedness and numbness.  All other systems reviewed and are negative.    Allergies  Doxycycline  Home Medications   Current Outpatient Rx  Name   Route  Sig  Dispense  Refill  . ALPRAZolam (XANAX) 1 MG tablet   Oral   Take 1 mg by mouth 3 (three) times daily as needed for anxiety.         Marland Kitchen HYDROcodone-acetaminophen (NORCO/VICODIN) 5-325 MG per tablet   Oral   Take 1 tablet by mouth every 6 (six) hours as needed for pain.   10 tablet   0     BP 128/91  Pulse 81  Temp(Src) 98.6 F (37 C) (Oral)  Resp 16  SpO2 98%  Physical Exam  Nursing note and vitals reviewed. Constitutional: She is oriented to person, place, and time. She appears well-developed and well-nourished. No distress.  HENT:  Head: Normocephalic and atraumatic.  Eyes: Conjunctivae are normal. Pupils are equal, round, and reactive to light.  Cardiovascular: Normal rate and regular rhythm.   No murmur heard. Pulmonary/Chest: Effort normal and breath sounds normal. No respiratory distress. She has no wheezes. She has no rales.  Abdominal: Soft. There is no tenderness.  Musculoskeletal: Normal range of motion. She exhibits no edema.  Neurological: She is alert and oriented to person, place, and time.  Skin: Skin is warm and dry. No rash noted.  Psychiatric: She has a normal mood and affect. Her behavior is normal.    ED Course  Procedures   Results for orders placed during the hospital encounter of 08/18/12  ACETAMINOPHEN LEVEL  Result Value Range   Acetaminophen (Tylenol), Serum <15.0  10 - 30 ug/mL  CBC      Result Value Range   WBC 7.8  4.0 - 10.5 K/uL   RBC 4.49  3.87 - 5.11 MIL/uL   Hemoglobin 13.1  12.0 - 15.0 g/dL   HCT 16.1  09.6 - 04.5 %   MCV 85.7  78.0 - 100.0 fL   MCH 29.2  26.0 - 34.0 pg   MCHC 34.0  30.0 - 36.0 g/dL   RDW 40.9  81.1 - 91.4 %   Platelets 223  150 - 400 K/uL  COMPREHENSIVE METABOLIC PANEL      Result Value Range   Sodium 137  135 - 145 mEq/L   Potassium 3.0 (*) 3.5 - 5.1 mEq/L   Chloride 103  96 - 112 mEq/L   CO2 24  19 - 32 mEq/L   Glucose, Bld 141 (*) 70 - 99 mg/dL   BUN 10  6 - 23 mg/dL   Creatinine, Ser  7.82  0.50 - 1.10 mg/dL   Calcium 9.4  8.4 - 95.6 mg/dL   Total Protein 7.5  6.0 - 8.3 g/dL   Albumin 4.4  3.5 - 5.2 g/dL   AST 15  0 - 37 U/L   ALT 8  0 - 35 U/L   Alkaline Phosphatase 60  39 - 117 U/L   Total Bilirubin 0.3  0.3 - 1.2 mg/dL   GFR calc non Af Amer 80 (*) >90 mL/min   GFR calc Af Amer >90  >90 mL/min  ETHANOL      Result Value Range   Alcohol, Ethyl (B) <11  0 - 11 mg/dL  SALICYLATE LEVEL      Result Value Range   Salicylate Lvl <2.0 (*) 2.8 - 20.0 mg/dL  URINE RAPID DRUG SCREEN (HOSP PERFORMED)      Result Value Range   Opiates NONE DETECTED  NONE DETECTED   Cocaine NONE DETECTED  NONE DETECTED   Benzodiazepines NONE DETECTED  NONE DETECTED   Amphetamines NONE DETECTED  NONE DETECTED   Tetrahydrocannabinol POSITIVE (*) NONE DETECTED   Barbiturates NONE DETECTED  NONE DETECTED  POCT PREGNANCY, URINE      Result Value Range   Preg Test, Ur NEGATIVE  NEGATIVE        1. Polysubstance abuse       MDM  10:50PM patient seen and evaluated. The patient appears well in no acute distress.  [facility].        Angus Seller, PA-C 08/19/12 0045

## 2012-08-18 NOTE — ED Notes (Signed)
Pt comfortable with d/c and f/u instructions. No prescriptions 

## 2012-08-18 NOTE — ED Notes (Signed)
Requesting detox from hydrocodone for 9 years of use and xanax that has been recent. Denies SI and HI.

## 2012-08-21 NOTE — ED Provider Notes (Signed)
Medical screening examination/treatment/procedure(s) were performed by non-physician practitioner and as supervising physician I was immediately available for consultation/collaboration.   Loren Racer, MD 08/21/12 [phone]

## 2012-08-24 ENCOUNTER — Encounter (HOSPITAL_BASED_OUTPATIENT_CLINIC_OR_DEPARTMENT_OTHER): Payer: Self-pay | Admitting: Family Medicine

## 2012-08-24 ENCOUNTER — Emergency Department (HOSPITAL_BASED_OUTPATIENT_CLINIC_OR_DEPARTMENT_OTHER)
Admission: EM | Admit: 2012-08-24 | Discharge: 2012-08-24 | Disposition: A | Discharge status: Home / Self Care | Payer: Self-pay | Attending: Emergency Medicine | Admitting: Emergency Medicine

## 2012-08-24 DIAGNOSIS — F1910 Other psychoactive substance abuse, uncomplicated: Secondary | ICD-10-CM | POA: Insufficient documentation

## 2012-08-24 DIAGNOSIS — F17200 Nicotine dependence, unspecified, uncomplicated: Secondary | ICD-10-CM | POA: Insufficient documentation

## 2012-08-24 DIAGNOSIS — Z79899 Other long term (current) drug therapy: Secondary | ICD-10-CM | POA: Insufficient documentation

## 2012-08-24 DIAGNOSIS — Z8739 Personal history of other diseases of the musculoskeletal system and connective tissue: Secondary | ICD-10-CM | POA: Insufficient documentation

## 2012-08-24 DIAGNOSIS — R110 Nausea: Secondary | ICD-10-CM | POA: Insufficient documentation

## 2012-08-24 HISTORY — DX: Other psychoactive substance abuse, uncomplicated: F19.10

## 2012-08-24 MED ORDER — PROMETHAZINE HCL 25 MG PO TABS
25.0000 mg | ORAL_TABLET | Freq: Four times a day (QID) | ORAL | Status: DC | PRN
  Administered 2012-08-24: 25 mg via ORAL
  Filled 2012-08-24: qty 1

## 2012-08-24 MED ORDER — PROMETHAZINE HCL 25 MG PO TABS: 25.0000 mg | ORAL_TABLET | Freq: Four times a day (QID) | ORAL | Status: DC | PRN

## 2012-08-24 NOTE — ED Notes (Signed)
Pt has been at [facility] rehab x 2 days. 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 Pt c/o nausea x 2 days. Denies vomiting, denies abdominal pain.

## 2012-08-24 NOTE — ED Provider Notes (Signed)
History    CSN: [id number] Arrival date & time 08/24/12  1001  First MD Initiated Contact with Patient 08/24/12 1026     Chief Complaint  Patient presents with  . Nausea   (Consider location/radiation/quality/duration/timing/severity/associated sxs/prior Treatment) The history is provided by the patient.   32 year old female resident day [facility] rehabilitation. Patient's care for Xanax and are tonic addiction. Patient complaining of nausea for 2 days. No vomiting associated with it denies abdominal pain denies any concern for pregnancy. Patient wanted something for nausea.   Past Medical History  Diagnosis Date  . Herniated lumbar disc without myelopathy   . Polysubstance abuse    History reviewed. No pertinent past surgical history. No family history on file. History  Substance Use Topics  . Smoking status: Current Every Day Smoker -- 1.00 packs/day    Types: Cigarettes  . Smokeless tobacco: Not on file  . Alcohol Use: Yes   OB History   Grav Para Term Preterm Abortions TAB SAB Ect Mult Living                 Review of Systems  Constitutional: Negative for fever.  HENT: Negative for congestion.   Eyes: Negative for visual disturbance.  Respiratory: Negative for shortness of breath.   Cardiovascular: Negative for chest pain.  Gastrointestinal: Positive for nausea. Negative for vomiting and diarrhea.  Genitourinary: Negative for dysuria.  Musculoskeletal: Negative for back pain.  Skin: Negative for rash.  Neurological: Negative for headaches.  Hematological: Does not bruise/bleed easily.  Psychiatric/Behavioral: Negative for confusion.    Allergies  Doxycycline  Home Medications   Current Outpatient Rx  Name  Route  Sig  Dispense  Refill  . ALPRAZolam (XANAX) 1 MG tablet   Oral   Take 1 mg by mouth 3 (three) times daily as needed for anxiety.         Marland Kitchen HYDROcodone-acetaminophen (NORCO/VICODIN) 5-325 MG per tablet   Oral   Take 1 tablet by mouth every 6  (six) hours as needed for pain.   10 tablet   0   . promethazine (PHENERGAN) 25 MG tablet   Oral   Take 1 tablet (25 mg total) by mouth every 6 (six) hours as needed for nausea.   20 tablet   1    BP 125/87  Pulse 92  Temp(Src) 98.2 F (36.8 C) (Oral)  Resp 20  Ht 5\' 1"  (1.549 m)  Wt 115 lb (52.164 kg)  BMI 21.74 kg/m2  SpO2 100%  LMP 07/27/2012 Physical Exam  Nursing note and vitals reviewed. Constitutional: She is oriented to person, place, and time. She appears well-developed and well-nourished. No distress.  HENT:  Head: Normocephalic and atraumatic.  Eyes: Conjunctivae and EOM are normal. Pupils are equal, round, and reactive to light.  Neck: Normal range of motion.  Cardiovascular: Normal rate, regular rhythm and normal heart sounds.   No murmur heard. Pulmonary/Chest: Effort normal and breath sounds normal.  Abdominal: Soft. There is no tenderness.  Musculoskeletal: Normal range of motion. She exhibits no edema.  Neurological: She is alert and oriented to person, place, and time. No cranial nerve deficit. She exhibits normal muscle tone. Coordination normal.  Skin: Skin is warm. No rash noted.    ED Course  Procedures (including critical care time) Labs Reviewed - No data to display No results found.    1. Nausea     MDM  [facility]. Patient care for Xanax and narcotic abuse. Patient complaining of  nausea for the past 2 days denies any vomiting or abdominal pain denies being pregnant. Will give patient a trial of Phenergan.  Shelda Jakes, MD 08/24/12 [phone]

## 2012-11-27 ENCOUNTER — Encounter (HOSPITAL_COMMUNITY): Payer: Self-pay | Admitting: *Deleted

## 2012-11-27 ENCOUNTER — Emergency Department (HOSPITAL_COMMUNITY)
Admission: EM | Admit: 2012-11-27 | Discharge: 2012-11-27 | Disposition: A | Discharge status: Home / Self Care | Payer: Self-pay | Attending: Emergency Medicine | Admitting: Emergency Medicine

## 2012-11-27 DIAGNOSIS — F1210 Cannabis abuse, uncomplicated: Secondary | ICD-10-CM | POA: Insufficient documentation

## 2012-11-27 DIAGNOSIS — Z8739 Personal history of other diseases of the musculoskeletal system and connective tissue: Secondary | ICD-10-CM | POA: Insufficient documentation

## 2012-11-27 DIAGNOSIS — F17200 Nicotine dependence, unspecified, uncomplicated: Secondary | ICD-10-CM | POA: Insufficient documentation

## 2012-11-27 DIAGNOSIS — Z3202 Encounter for pregnancy test, result negative: Secondary | ICD-10-CM | POA: Insufficient documentation

## 2012-11-27 DIAGNOSIS — F1310 Sedative, hypnotic or anxiolytic abuse, uncomplicated: Secondary | ICD-10-CM | POA: Insufficient documentation

## 2012-11-27 DIAGNOSIS — F1410 Cocaine abuse, uncomplicated: Secondary | ICD-10-CM | POA: Insufficient documentation

## 2012-11-27 DIAGNOSIS — R062 Wheezing: Secondary | ICD-10-CM | POA: Insufficient documentation

## 2012-11-27 DIAGNOSIS — F1110 Opioid abuse, uncomplicated: Secondary | ICD-10-CM | POA: Insufficient documentation

## 2012-11-27 LAB — ETHANOL: Alcohol, Ethyl (B): <11 mg/dL (ref 0–11)

## 2012-11-27 LAB — POCT PREGNANCY, URINE
Preg Test, Ur: NEGATIVE
Preg Test, Ur: NEGATIVE

## 2012-11-27 LAB — CBC
HCT: 42.8 % (ref 36.0–46.0)
Hemoglobin: 14.6 g/dL (ref 12.0–15.0)
MCV: 89.5 fL (ref 78.0–100.0)
RBC: 4.78 MIL/uL (ref 3.87–5.11)
WBC: 8.3 10*3/uL (ref 4.0–10.5)

## 2012-11-27 LAB — RAPID URINE DRUG SCREEN, HOSP PERFORMED
Opiates: NOT DETECTED
Tetrahydrocannabinol: POSITIVE — AB

## 2012-11-27 LAB — COMPREHENSIVE METABOLIC PANEL
Alkaline Phosphatase: 81 U/L (ref 39–117)
BUN: 11 mg/dL (ref 6–23)
CO2: 25 mEq/L (ref 19–32)
Chloride: 100 mEq/L (ref 96–112)
Creatinine, Ser: 0.96 mg/dL (ref 0.50–1.10)
GFR calc non Af Amer: 76 mL/min — ABNORMAL LOW (ref >90)
Total Bilirubin: 0.4 mg/dL (ref 0.3–1.2)

## 2012-11-27 MED ORDER — ALBUTEROL SULFATE HFA 108 (90 BASE) MCG/ACT IN AERS: 2.0000 | INHALATION_SPRAY | RESPIRATORY_TRACT | Status: DC | PRN

## 2012-11-27 NOTE — ED Provider Notes (Signed)
CSN: [id number]     Arrival date & time 11/27/12  1514 History   First MD Initiated Contact with Patient 11/27/12 1725     Chief Complaint  Patient presents with  . Medical Clearance   (Consider location/radiation/quality/duration/timing/severity/associated sxs/prior Treatment) HPI Patient reports she got out of prison in January. She had been in prison for 90 days for breaking her parole by having a positive urine drug screen for marijuana, and "everything" which includes OxyContin, crack, cocaine and Xanax. She states after she left prison she was supposed to go to a 30 day treatment program but she did not. She reports she continued to use marijuana and at the end of June she was admitted to [facility] for 30 days. She was discharged on July 22. She states the only substance she is abusing currently is marijuana. She states she has not had any benzodiazepines for couple months. She states she did drink one 22 ounce of beer last night and the time before that was 6-8 months ago. She states her parole officer has arranged for her to be admitted at [facility] in [place]. She is here to get medical clearance. She states whenever they have a bed for her her oral officer will transport her to that facility. She does not need to remain in the emergency department until a bed becomes available. She states this is a court order detox admission. She denies coughing, rhinorrhea, sore throat, nausea, diarrhea, fever. She states she feels fine.   PCP none  Past Medical History  Diagnosis Date  . Herniated lumbar disc without myelopathy   . Polysubstance abuse    History reviewed. No pertinent past surgical history. History reviewed. No pertinent family history. History  Substance Use Topics  . Smoking status: Current Every Day Smoker -- 1.00 packs/day    Types: Cigarettes  . Smokeless tobacco: Not on file  . Alcohol Use: Yes   employed  OB History   Grav Para Term Preterm  Abortions TAB SAB Ect Mult Living                 Review of Systems  All other systems reviewed and are negative.    Allergies  Doxycycline  Home Medications   Current Outpatient Rx  Name  Route  Sig  Dispense  Refill  . diphenhydrAMINE (BENADRYL) 25 MG tablet   Oral   Take 25 mg by mouth every 6 (six) hours as needed for itching or allergies.         Marland Kitchen ibuprofen (ADVIL,MOTRIN) 200 MG tablet   Oral   Take 400 mg by mouth every 6 (six) hours as needed for pain.          BP 143/91  Pulse 110  Temp(Src) 97.9 F (36.6 C) (Oral)  Resp 18  SpO2 100%  LMP 11/23/2012  Vital signs normal except tachycardia  Physical Exam  Nursing note and vitals reviewed. Constitutional: She is oriented to person, place, and time. She appears well-developed and well-nourished.  Non-toxic appearance. She does not appear ill. No distress.  HENT:  Head: Normocephalic and atraumatic.  Right Ear: External ear normal.  Left Ear: External ear normal.  Nose: Nose normal. No mucosal edema or rhinorrhea.  Mouth/Throat: Oropharynx is clear and moist and mucous membranes are normal. No dental abscesses or edematous.  Eyes: Conjunctivae and EOM are normal. Pupils are equal, round, and reactive to light.  Neck: Normal range of motion and full passive range of motion without  pain. Neck supple.  Cardiovascular: Normal rate, regular rhythm and normal heart sounds.  Exam reveals no gallop and no friction rub.   No murmur heard. Pulmonary/Chest: Effort normal. No respiratory distress. She has wheezes. She has no rhonchi. She has no rales. She exhibits no tenderness and no crepitus.  Few scattered wheezes, no retractions, or signs of distress  Abdominal: Soft. Normal appearance and bowel sounds are normal. She exhibits no distension. There is no tenderness. There is no rebound and no guarding.  Musculoskeletal: Normal range of motion. She exhibits no edema and no tenderness.  Moves all extremities well.    Neurological: She is alert and oriented to person, place, and time. She has normal strength. No cranial nerve deficit.  Skin: Skin is warm, dry and intact. No rash noted. No erythema. No pallor.  Psychiatric: She has a normal mood and affect. Her speech is normal and behavior is normal. Her mood appears not anxious.    ED Course  Procedures (including critical care time)  Pt states she has used inhalers in the past. Does not want a treatment at this time.   Labs Review Results for orders placed during the hospital encounter of 11/27/12  CBC      Result Value Range   WBC 8.3  4.0 - 10.5 K/uL   RBC 4.78  3.87 - 5.11 MIL/uL   Hemoglobin 14.6  12.0 - 15.0 g/dL   HCT 16.1  09.6 - 04.5 %   MCV 89.5  78.0 - 100.0 fL   MCH 30.5  26.0 - 34.0 pg   MCHC 34.1  30.0 - 36.0 g/dL   RDW 40.9  81.1 - 91.4 %   Platelets 309  150 - 400 K/uL  COMPREHENSIVE METABOLIC PANEL      Result Value Range   Sodium 136  135 - 145 mEq/L   Potassium 4.0  3.5 - 5.1 mEq/L   Chloride 100  96 - 112 mEq/L   CO2 25  19 - 32 mEq/L   Glucose, Bld 79  70 - 99 mg/dL   BUN 11  6 - 23 mg/dL   Creatinine, Ser 7.82  0.50 - 1.10 mg/dL   Calcium 9.6  8.4 - 95.6 mg/dL   Total Protein 7.8  6.0 - 8.3 g/dL   Albumin 4.1  3.5 - 5.2 g/dL   AST 20  0 - 37 U/L   ALT 14  0 - 35 U/L   Alkaline Phosphatase 81  39 - 117 U/L   Total Bilirubin 0.4  0.3 - 1.2 mg/dL   GFR calc non Af Amer 76 (*) >90 mL/min   GFR calc Af Amer 88 (*) >90 mL/min  ETHANOL      Result Value Range   Alcohol, Ethyl (B) <11  0 - 11 mg/dL  URINE RAPID DRUG SCREEN (HOSP PERFORMED)      Result Value Range   Opiates NONE DETECTED  NONE DETECTED   Cocaine NONE DETECTED  NONE DETECTED   Benzodiazepines POSITIVE (*) NONE DETECTED   Amphetamines NONE DETECTED  NONE DETECTED   Tetrahydrocannabinol POSITIVE (*) NONE DETECTED   Barbiturates NONE DETECTED  NONE DETECTED  POCT PREGNANCY, URINE      Result Value Range   Preg Test, Ur NEGATIVE  NEGATIVE  POCT  PREGNANCY, URINE      Result Value Range   Preg Test, Ur NEGATIVE  NEGATIVE   Laboratory interpretation all normal except +UDS    MDM  patient has history  of wheezing and has a history of smoking. She has no symptoms to be concerning for bronchitis. Patient is to quit smoking. She is medically cleared to go to her rehabilitation facility.    1. Marijuana abuse   2. Benzodiazepine abuse   3. Wheezing     New Prescriptions   ALBUTEROL (PROVENTIL HFA;VENTOLIN HFA) 108 (90 BASE) MCG/ACT INHALER    Inhale 2 puffs into the lungs every 4 (four) hours as needed for wheezing.     Plan discharge   Devoria Albe, MD, Franz Dell, MD 11/27/12 [phone]

## 2012-11-27 NOTE — ED Notes (Signed)
Pt requesting detox from marijuana, denies any SI or HI.

## 2012-12-10 ENCOUNTER — Emergency Department (HOSPITAL_COMMUNITY)
Admission: EM | Admit: 2012-12-10 | Discharge: 2012-12-10 | Disposition: A | Discharge status: Home / Self Care | Payer: Self-pay | Attending: Emergency Medicine | Admitting: Emergency Medicine

## 2012-12-10 ENCOUNTER — Encounter (HOSPITAL_COMMUNITY): Payer: Self-pay | Admitting: Emergency Medicine

## 2012-12-10 DIAGNOSIS — Z0289 Encounter for other administrative examinations: Secondary | ICD-10-CM | POA: Insufficient documentation

## 2012-12-10 DIAGNOSIS — Z79899 Other long term (current) drug therapy: Secondary | ICD-10-CM | POA: Insufficient documentation

## 2012-12-10 DIAGNOSIS — Z3202 Encounter for pregnancy test, result negative: Secondary | ICD-10-CM | POA: Insufficient documentation

## 2012-12-10 DIAGNOSIS — Z008 Encounter for other general examination: Secondary | ICD-10-CM

## 2012-12-10 DIAGNOSIS — Z8739 Personal history of other diseases of the musculoskeletal system and connective tissue: Secondary | ICD-10-CM | POA: Insufficient documentation

## 2012-12-10 DIAGNOSIS — F17200 Nicotine dependence, unspecified, uncomplicated: Secondary | ICD-10-CM | POA: Insufficient documentation

## 2012-12-10 LAB — RAPID URINE DRUG SCREEN, HOSP PERFORMED
Amphetamines: NOT DETECTED
Barbiturates: NOT DETECTED
Benzodiazepines: NOT DETECTED
Tetrahydrocannabinol: POSITIVE — AB

## 2012-12-10 LAB — BASIC METABOLIC PANEL
BUN: 10 mg/dL (ref 6–23)
CO2: 25 mEq/L (ref 19–32)
Chloride: 99 mEq/L (ref 96–112)
Glucose, Bld: 97 mg/dL (ref 70–99)
Potassium: 3.6 mEq/L (ref 3.5–5.1)

## 2012-12-10 LAB — CBC WITH DIFFERENTIAL/PLATELET
HCT: 43.5 % (ref 36.0–46.0)
Hemoglobin: 15.3 g/dL — ABNORMAL HIGH (ref 12.0–15.0)
Lymphocytes Relative: 24 % (ref 12–46)
Lymphs Abs: 2.2 10*3/uL (ref 0.7–4.0)
MCH: 30.7 pg (ref 26.0–34.0)
Monocytes Relative: 6 % (ref 3–12)
Neutro Abs: 6 10*3/uL (ref 1.7–7.7)
Neutrophils Relative %: 67 % (ref 43–77)
Platelets: 264 10*3/uL (ref 150–400)
RBC: 4.98 MIL/uL (ref 3.87–5.11)

## 2012-12-10 LAB — URINALYSIS, ROUTINE W REFLEX MICROSCOPIC
Ketones, ur: NEGATIVE mg/dL
Leukocytes, UA: NEGATIVE
Nitrite: NEGATIVE
Protein, ur: NEGATIVE mg/dL
Urobilinogen, UA: 0.2 mg/dL (ref 0.0–1.0)

## 2012-12-10 LAB — ACETAMINOPHEN LEVEL: Acetaminophen (Tylenol), Serum: <15 ug/mL (ref 10–30)

## 2012-12-10 LAB — SALICYLATE LEVEL: Salicylate Lvl: <2 mg/dL — ABNORMAL LOW (ref 2.8–20.0)

## 2012-12-10 NOTE — ED Notes (Signed)
Pt sts tested positive for benzos and marijuana on here probation drug test last week; pt sts took benzo last week and not since then and hx of marijuana use; pt denies other drug use or SI/HI

## 2012-12-10 NOTE — ED Provider Notes (Signed)
CSN: [id number]     Arrival date & time 12/10/12  0907 History   First MD Initiated Contact with Patient 12/10/12 [phone]     Chief Complaint  Patient presents with  . Addiction Problem   (Consider location/radiation/quality/duration/timing/severity/associated sxs/prior Treatment) HPI Comments: Patient is a 32 year old female with a past medical history of polysubstance abuse who presents to the ED for medical clearance. Patient is currently on probation and meets with her Probation officer regularly. She was sent to the ED by her Probation officer due to taking Xanax, which she tested positive for on her drug test last week. Patient denies any other drug use and reports being complaint with her Engineer, drilling. The patient is instructed to go to [facility] in [place] from the ED. Patient denies SI/HI.    Past Medical History  Diagnosis Date  . Herniated lumbar disc without myelopathy   . Polysubstance abuse    History reviewed. No pertinent past surgical history. History reviewed. No pertinent family history. History  Substance Use Topics  . Smoking status: Current Every Day Smoker -- 1.00 packs/day    Types: Cigarettes  . Smokeless tobacco: Not on file  . Alcohol Use: Yes   OB History   Grav Para Term Preterm Abortions TAB SAB Ect Mult Living                 Review of Systems  Psychiatric/Behavioral:       Substance abuse  All other systems reviewed and are negative.    Allergies  Doxycycline  Home Medications   Current Outpatient Rx  Name  Route  Sig  Dispense  Refill  . ALPRAZolam (XANAX) 1 MG tablet   Oral   Take 1 mg by mouth 3 (three) times daily as needed for anxiety.         Marland Kitchen ibuprofen (ADVIL,MOTRIN) 200 MG tablet   Oral   Take 400 mg by mouth every 6 (six) hours as needed for pain.          BP 141/108  Temp(Src) 97.9 F (36.6 C) (Oral)  Resp 18  Ht 5\' 1"  (1.549 m)  Wt 115 lb (52.164 kg)  BMI 21.74 kg/m2  SpO2 100%  LMP 11/23/2012 Physical Exam   Nursing note and vitals reviewed. Constitutional: She is oriented to person, place, and time. She appears well-developed and well-nourished. No distress.  HENT:  Head: Normocephalic and atraumatic.  Eyes: Conjunctivae are normal.  Neck: Normal range of motion.  Cardiovascular: Normal rate and regular rhythm.  Exam reveals no gallop and no friction rub.   No murmur heard. Pulmonary/Chest: Effort normal and breath sounds normal. She has no wheezes. She has no rales. She exhibits no tenderness.  Abdominal: Soft. There is no tenderness.  Musculoskeletal: Normal range of motion.  Neurological: She is alert and oriented to person, place, and time. Coordination normal.  Speech is goal-oriented. Moves limbs without ataxia.   Skin: Skin is warm and dry.  Psychiatric: Her behavior is normal.    ED Course  Procedures (including critical care time) Labs Review Labs Reviewed  CBC WITH DIFFERENTIAL - Abnormal; Notable for the following:    Hemoglobin 15.3 (*)    All other components within normal limits  BASIC METABOLIC PANEL - Abnormal; Notable for the following:    GFR calc non Af Amer 70 (*)    GFR calc Af Amer 81 (*)    All other components within normal limits  URINE RAPID DRUG SCREEN (HOSP PERFORMED) -  Abnormal; Notable for the following:    Tetrahydrocannabinol POSITIVE (*)    All other components within normal limits  URINALYSIS, ROUTINE W REFLEX MICROSCOPIC - Abnormal; Notable for the following:    Hgb urine dipstick SMALL (*)    All other components within normal limits  SALICYLATE LEVEL - Abnormal; Notable for the following:    Salicylate Lvl <2.0 (*)    All other components within normal limits  PREGNANCY, URINE  ETHANOL  ACETAMINOPHEN LEVEL  URINE MICROSCOPIC-ADD ON   Imaging Review No results found.  EKG Interpretation   None       MDM   1. Encounter for medical clearance for patient hold     11:18 AM Labs unremarkable for acute changes. Tetrahydrocannabinol  is the only substance in her UDS. Patient will be discharged to go to [facility] in [place]. I spoke with Jeralene Huff, the patient's parole officer who would like for [place] to arrange for that patient to get to [facility], however, we do not send the patient there if we did not make arrangements for her. Patient will be discharged with a copy of her labs from today and will get to [facility] on her own. Patient aware of the plan and expresses understanding. Vitals stable and patient afebrile.     Emilia Beck, [place] 12/11/12 [phone]

## 2012-12-12 NOTE — ED Provider Notes (Signed)
Medical screening examination/treatment/procedure(s) were performed by non-physician practitioner and as supervising physician I was immediately available for consultation/collaboration.    Jesika Men W. Lysa Livengood, MD 12/12/12 1037 

## 2012-12-30 ENCOUNTER — Emergency Department (HOSPITAL_COMMUNITY)
Admission: EM | Admit: 2012-12-30 | Discharge: 2012-12-30 | Disposition: A | Discharge status: Home / Self Care | Payer: Self-pay | Attending: Emergency Medicine | Admitting: Emergency Medicine

## 2012-12-30 ENCOUNTER — Emergency Department (HOSPITAL_COMMUNITY): Payer: Self-pay

## 2012-12-30 DIAGNOSIS — Z8739 Personal history of other diseases of the musculoskeletal system and connective tissue: Secondary | ICD-10-CM | POA: Insufficient documentation

## 2012-12-30 DIAGNOSIS — R091 Pleurisy: Secondary | ICD-10-CM | POA: Insufficient documentation

## 2012-12-30 DIAGNOSIS — Z79899 Other long term (current) drug therapy: Secondary | ICD-10-CM | POA: Insufficient documentation

## 2012-12-30 DIAGNOSIS — R109 Unspecified abdominal pain: Secondary | ICD-10-CM | POA: Insufficient documentation

## 2012-12-30 DIAGNOSIS — F17200 Nicotine dependence, unspecified, uncomplicated: Secondary | ICD-10-CM | POA: Insufficient documentation

## 2012-12-30 LAB — COMPREHENSIVE METABOLIC PANEL
ALT: 12 U/L (ref 0–35)
Alkaline Phosphatase: 72 U/L (ref 39–117)
BUN: 9 mg/dL (ref 6–23)
CO2: 23 mEq/L (ref 19–32)
Calcium: 9.6 mg/dL (ref 8.4–10.5)
Chloride: 101 mEq/L (ref 96–112)
Creatinine, Ser: 0.91 mg/dL (ref 0.50–1.10)
GFR calc Af Amer: >90 mL/min (ref >90)
GFR calc non Af Amer: 81 mL/min — ABNORMAL LOW (ref >90)
Glucose, Bld: 89 mg/dL (ref 70–99)
Potassium: 3.9 mEq/L (ref 3.5–5.1)
Sodium: 135 mEq/L (ref 135–145)
Total Bilirubin: 0.4 mg/dL (ref 0.3–1.2)
Total Protein: 7.8 g/dL (ref 6.0–8.3)

## 2012-12-30 LAB — URINALYSIS, ROUTINE W REFLEX MICROSCOPIC
Bilirubin Urine: NEGATIVE
Ketones, ur: NEGATIVE mg/dL
Nitrite: NEGATIVE
Protein, ur: NEGATIVE mg/dL
Specific Gravity, Urine: 1.001 — ABNORMAL LOW (ref 1.005–1.030)
Urobilinogen, UA: 0.2 mg/dL (ref 0.0–1.0)

## 2012-12-30 LAB — CBC WITH DIFFERENTIAL/PLATELET
Eosinophils Relative: 1 % (ref 0–5)
Lymphocytes Relative: 30 % (ref 12–46)
Lymphs Abs: 2.6 10*3/uL (ref 0.7–4.0)
MCHC: 34.9 g/dL (ref 30.0–36.0)
MCV: 88.5 fL (ref 78.0–100.0)
Platelets: 258 10*3/uL (ref 150–400)
RBC: 5.05 MIL/uL (ref 3.87–5.11)
WBC: 8.5 10*3/uL (ref 4.0–10.5)

## 2012-12-30 LAB — RAPID URINE DRUG SCREEN, HOSP PERFORMED
Amphetamines: NOT DETECTED
Barbiturates: NOT DETECTED
Opiates: NOT DETECTED
Tetrahydrocannabinol: NOT DETECTED

## 2012-12-30 LAB — URINE MICROSCOPIC-ADD ON

## 2012-12-30 LAB — LIPASE, BLOOD: Lipase: 33 U/L (ref 11–59)

## 2012-12-30 MED ORDER — NAPROXEN 500 MG PO TABS: 500.0000 mg | ORAL_TABLET | Freq: Two times a day (BID) | ORAL | Status: DC

## 2012-12-30 NOTE — ED Notes (Signed)
Pt c/o upper rt sided abd pain that started 3wks ago. Pt states it is constant with intermittent sharp pains when she takes deep breaths. Pt states she took some aleve around 10:30am and had no relief. Pt states she is detoxing from marijuana. She has had some nausea and vomiting when drinking water. Pt states she also feels like she is bloated.

## 2012-12-30 NOTE — ED Notes (Addendum)
Upper abd pain and chest pain x 3 weeks  Some nausea ,states needs to be med cleared for detox program she is going into

## 2012-12-30 NOTE — ED Notes (Signed)
Discharge instructions reviewed with pt. Pt verbalized understanding.   

## 2012-12-30 NOTE — ED Provider Notes (Signed)
CSN: [id number]     Arrival date & time 12/30/12  1432 History   First MD Initiated Contact with Patient 12/30/12 1636     Chief Complaint  Patient presents with  . Abdominal Pain  . Chest Pain    HPI  Patient presents with a request once his evaluation for some right-sided sharp chest pain has been present for months time. Her son she smokes or takes deep breaths or cough. Not short of breath. No hemoptysis. No sputum production. No falls injuries or trauma the area. Request #2 is that she states she is going to [place] to an inpatient detox center for a history of benzodiazepine use. She states she has had a "clean toxicology. States she has been off of marijuana for a month and request a urine toxicology to "be sure on clean before I go all the way over there". No history of heart disease. No history lung disease or asthma. No history of PE. No prolonged immobilization cast splints fractures surgeries limits each other DVT or PE risks  Past Medical History  Diagnosis Date  . Herniated lumbar disc without myelopathy   . Polysubstance abuse    No past surgical history on file. No family history on file. History  Substance Use Topics  . Smoking status: Current Every Day Smoker -- 1.00 packs/day    Types: Cigarettes  . Smokeless tobacco: Not on file  . Alcohol Use: Yes   OB History   Grav Para Term Preterm Abortions TAB SAB Ect Mult Living                 Review of Systems  Constitutional: Negative for fever, chills, diaphoresis, appetite change and fatigue.  HENT: Negative for mouth sores, sore throat and trouble swallowing.   Eyes: Negative for visual disturbance.  Respiratory: Negative for cough, chest tightness, shortness of breath and wheezing.   Cardiovascular: Positive for chest pain.  Gastrointestinal: Negative for nausea, vomiting, abdominal pain, diarrhea and abdominal distention.  Endocrine: Negative for polydipsia, polyphagia and polyuria.  Genitourinary: Negative  for dysuria, frequency and hematuria.  Musculoskeletal: Negative for gait problem.  Skin: Negative for color change, pallor and rash.  Neurological: Negative for dizziness, syncope, light-headedness and headaches.  Hematological: Does not bruise/bleed easily.  Psychiatric/Behavioral: Negative for behavioral problems and confusion.    Allergies  Doxycycline  Home Medications   Current Outpatient Rx  Name  Route  Sig  Dispense  Refill  . ALPRAZolam (XANAX) 1 MG tablet   Oral   Take 1 mg by mouth 3 (three) times daily as needed for anxiety.         Marland Kitchen ibuprofen (ADVIL,MOTRIN) 200 MG tablet   Oral   Take 400 mg by mouth every 6 (six) hours as needed for pain.         . naproxen sodium (ALEVE) 220 MG tablet   Oral   Take 220 mg by mouth 2 (two) times daily with a meal.         . naproxen (NAPROSYN) 500 MG tablet   Oral   Take 1 tablet (500 mg total) by mouth 2 (two) times daily.   30 tablet   0    BP 127/100  Pulse 102  Temp(Src) 98.2 F (36.8 C)  Resp 16  SpO2 99%  LMP 11/29/2012 Physical Exam  Constitutional: She is oriented to person, place, and time. She appears well-developed and well-nourished. No distress.  HENT:  Head: Normocephalic.  Eyes: Conjunctivae are normal. Pupils  are equal, round, and reactive to light. No scleral icterus.  Neck: Normal range of motion. Neck supple. No thyromegaly present.  Cardiovascular: Normal rate and regular rhythm.  Exam reveals no gallop and no friction rub.   No murmur heard. Pulmonary/Chest: Effort normal and breath sounds normal. No respiratory distress. She has no wheezes. She has no rales.  Abdominal: Soft. Bowel sounds are normal. She exhibits no distension. There is no tenderness. There is no rebound.  Musculoskeletal: Normal range of motion.  Neurological: She is alert and oriented to person, place, and time.  Skin: Skin is warm and dry. No rash noted.  Psychiatric: She has a normal mood and affect. Her behavior is  normal.    ED Course  Procedures (including critical care time) Labs Review Labs Reviewed  CBC WITH DIFFERENTIAL - Abnormal; Notable for the following:    Hemoglobin 15.6 (*)    All other components within normal limits  COMPREHENSIVE METABOLIC PANEL - Abnormal; Notable for the following:    GFR calc non Af Amer 81 (*)    All other components within normal limits  URINALYSIS, ROUTINE W REFLEX MICROSCOPIC - Abnormal; Notable for the following:    APPearance CLOUDY (*)    Specific Gravity, Urine 1.001 (*)    Hgb urine dipstick TRACE (*)    All other components within normal limits  URINE MICROSCOPIC-ADD ON - Abnormal; Notable for the following:    Squamous Epithelial / LPF FEW (*)    All other components within normal limits  LIPASE, BLOOD  URINE RAPID DRUG SCREEN (HOSP PERFORMED)  ETHANOL   Imaging Review Dg Chest 2 View  12/30/2012   CLINICAL DATA:  Right-sided chest pain. Shortness of breath.  EXAM: CHEST  2 VIEW  COMPARISON:  07/22/2007  FINDINGS: The heart size and mediastinal contours are within normal limits. Both lungs are clear. The visualized skeletal structures are unremarkable.  IMPRESSION: No active cardiopulmonary disease.   Electronically Signed   By: Myles Rosenthal M.D.   On: 12/30/2012 15:35    EKG Interpretation   None       MDM   1. Pleurisy    Negative tox.  Normal chest x-ray. Normal troponin. EKG showed no acute or ischemic changes. Normal sinus rhythm. I think her symptoms are consistent with simple pleurisy. Plan is anti-inflammatories. Given a copy of her negative toxicology take with her to her rehabilitation stay.    Roney Marion, MD 12/30/12 1739

## 2015-02-26 ENCOUNTER — Encounter (HOSPITAL_COMMUNITY): Payer: Self-pay | Admitting: *Deleted

## 2015-02-26 ENCOUNTER — Emergency Department (HOSPITAL_COMMUNITY)
Admission: EM | Admit: 2015-02-26 | Discharge: 2015-02-26 | Discharge status: Against Medical Advice | Payer: Self-pay | Attending: Emergency Medicine | Admitting: Emergency Medicine

## 2015-02-26 DIAGNOSIS — R109 Unspecified abdominal pain: Secondary | ICD-10-CM | POA: Insufficient documentation

## 2015-02-26 DIAGNOSIS — F17210 Nicotine dependence, cigarettes, uncomplicated: Secondary | ICD-10-CM | POA: Insufficient documentation

## 2015-02-26 DIAGNOSIS — R05 Cough: Secondary | ICD-10-CM | POA: Insufficient documentation

## 2015-02-26 LAB — URINALYSIS, ROUTINE W REFLEX MICROSCOPIC
Bilirubin Urine: NEGATIVE
Glucose, UA: NEGATIVE mg/dL
Hgb urine dipstick: NEGATIVE
Ketones, ur: NEGATIVE mg/dL
LEUKOCYTES UA: NEGATIVE
Nitrite: NEGATIVE
PH: 6.5 (ref 5.0–8.0)
Protein, ur: NEGATIVE mg/dL
SPECIFIC GRAVITY, URINE: 1.013 (ref 1.005–1.030)

## 2015-02-26 NOTE — ED Notes (Addendum)
Pt complains of left flank pain for the past 3 weeks. Pt states she feels she urinated less frequently than normal yesterday despite normal hydration. Pt denies dysuria. Pt states she also has cough for the past month.

## 2015-05-22 ENCOUNTER — Emergency Department (HOSPITAL_COMMUNITY)
Admission: EM | Admit: 2015-05-22 | Discharge: 2015-05-22 | Disposition: A | Discharge status: Home / Self Care | Payer: Self-pay | Attending: Emergency Medicine | Admitting: Emergency Medicine

## 2015-05-22 ENCOUNTER — Emergency Department (HOSPITAL_COMMUNITY): Payer: Self-pay

## 2015-05-22 ENCOUNTER — Encounter (HOSPITAL_COMMUNITY): Payer: Self-pay | Admitting: *Deleted

## 2015-05-22 DIAGNOSIS — H9203 Otalgia, bilateral: Secondary | ICD-10-CM | POA: Insufficient documentation

## 2015-05-22 DIAGNOSIS — R0602 Shortness of breath: Secondary | ICD-10-CM | POA: Insufficient documentation

## 2015-05-22 DIAGNOSIS — F329 Major depressive disorder, single episode, unspecified: Secondary | ICD-10-CM | POA: Insufficient documentation

## 2015-05-22 DIAGNOSIS — J029 Acute pharyngitis, unspecified: Secondary | ICD-10-CM | POA: Insufficient documentation

## 2015-05-22 DIAGNOSIS — Z7982 Long term (current) use of aspirin: Secondary | ICD-10-CM | POA: Insufficient documentation

## 2015-05-22 DIAGNOSIS — R109 Unspecified abdominal pain: Secondary | ICD-10-CM | POA: Insufficient documentation

## 2015-05-22 DIAGNOSIS — R51 Headache: Secondary | ICD-10-CM | POA: Insufficient documentation

## 2015-05-22 DIAGNOSIS — M791 Myalgia: Secondary | ICD-10-CM | POA: Insufficient documentation

## 2015-05-22 DIAGNOSIS — J3489 Other specified disorders of nose and nasal sinuses: Secondary | ICD-10-CM | POA: Insufficient documentation

## 2015-05-22 DIAGNOSIS — F419 Anxiety disorder, unspecified: Secondary | ICD-10-CM | POA: Insufficient documentation

## 2015-05-22 DIAGNOSIS — Z3202 Encounter for pregnancy test, result negative: Secondary | ICD-10-CM | POA: Insufficient documentation

## 2015-05-22 DIAGNOSIS — R062 Wheezing: Secondary | ICD-10-CM | POA: Insufficient documentation

## 2015-05-22 DIAGNOSIS — R509 Fever, unspecified: Secondary | ICD-10-CM | POA: Insufficient documentation

## 2015-05-22 DIAGNOSIS — R05 Cough: Secondary | ICD-10-CM | POA: Insufficient documentation

## 2015-05-22 DIAGNOSIS — Z79899 Other long term (current) drug therapy: Secondary | ICD-10-CM | POA: Insufficient documentation

## 2015-05-22 DIAGNOSIS — R6889 Other general symptoms and signs: Secondary | ICD-10-CM

## 2015-05-22 DIAGNOSIS — F17210 Nicotine dependence, cigarettes, uncomplicated: Secondary | ICD-10-CM | POA: Insufficient documentation

## 2015-05-22 LAB — COMPREHENSIVE METABOLIC PANEL
ALBUMIN: 3.8 g/dL (ref 3.5–5.0)
ALT: 47 U/L (ref 14–54)
ANION GAP: 11 (ref 5–15)
AST: 114 U/L — ABNORMAL HIGH (ref 15–41)
Alkaline Phosphatase: 67 U/L (ref 38–126)
BILIRUBIN TOTAL: 0.4 mg/dL (ref 0.3–1.2)
BUN: <5 mg/dL — ABNORMAL LOW (ref 6–20)
CHLORIDE: 101 mmol/L (ref 101–111)
CO2: 24 mmol/L (ref 22–32)
Calcium: 8.8 mg/dL — ABNORMAL LOW (ref 8.9–10.3)
Creatinine, Ser: 1.16 mg/dL — ABNORMAL HIGH (ref 0.44–1.00)
GFR calc Af Amer: >60 mL/min (ref >60)
GFR calc non Af Amer: 59 mL/min — ABNORMAL LOW (ref >60)
GLUCOSE: 158 mg/dL — AB (ref 65–99)
POTASSIUM: 3.3 mmol/L — AB (ref 3.5–5.1)
Sodium: 136 mmol/L (ref 135–145)
TOTAL PROTEIN: 6.8 g/dL (ref 6.5–8.1)

## 2015-05-22 LAB — URINE MICROSCOPIC-ADD ON

## 2015-05-22 LAB — CBC
HCT: 41.8 % (ref 36.0–46.0)
Hemoglobin: 13.5 g/dL (ref 12.0–15.0)
MCH: 27.8 pg (ref 26.0–34.0)
MCHC: 32.3 g/dL (ref 30.0–36.0)
MCV: 86 fL (ref 78.0–100.0)
Platelets: 134 10*3/uL — ABNORMAL LOW (ref 150–400)
RBC: 4.86 MIL/uL (ref 3.87–5.11)
RDW: 13.9 % (ref 11.5–15.5)
WBC: 3.8 10*3/uL — ABNORMAL LOW (ref 4.0–10.5)

## 2015-05-22 LAB — I-STAT BETA HCG BLOOD, ED (MC, WL, AP ONLY): I-stat hCG, quantitative: <5 m[IU]/mL

## 2015-05-22 LAB — URINALYSIS, ROUTINE W REFLEX MICROSCOPIC
Bilirubin Urine: NEGATIVE
Glucose, UA: NEGATIVE mg/dL
Ketones, ur: NEGATIVE mg/dL
Leukocytes, UA: NEGATIVE
Nitrite: NEGATIVE
Protein, ur: 100 mg/dL — AB
Specific Gravity, Urine: 1.009 (ref 1.005–1.030)
pH: 6 (ref 5.0–8.0)

## 2015-05-22 LAB — LIPASE, BLOOD: LIPASE: 30 U/L (ref 11–51)

## 2015-05-22 MED ORDER — ALBUTEROL SULFATE (2.5 MG/3ML) 0.083% IN NEBU
5.0000 mg | INHALATION_SOLUTION | Freq: Once | RESPIRATORY_TRACT | Status: AC
  Administered 2015-05-22: 5 mg via RESPIRATORY_TRACT
  Filled 2015-05-22: qty 6

## 2015-05-22 MED ORDER — BENZONATATE 100 MG PO CAPS: 200.0000 mg | ORAL_CAPSULE | Freq: Two times a day (BID) | ORAL | Status: DC | PRN

## 2015-05-22 MED ORDER — ALBUTEROL SULFATE HFA 108 (90 BASE) MCG/ACT IN AERS
2.0000 | INHALATION_SPRAY | Freq: Once | RESPIRATORY_TRACT | Status: AC
  Administered 2015-05-22: 2 via RESPIRATORY_TRACT
  Filled 2015-05-22: qty 6.7

## 2015-05-22 MED ORDER — OXYMETAZOLINE HCL 0.05 % NA SOLN: 1.0000 | Freq: Two times a day (BID) | NASAL | Status: DC

## 2015-05-22 MED ORDER — PREDNISONE 20 MG PO TABS
60.0000 mg | ORAL_TABLET | Freq: Once | ORAL | Status: AC
  Administered 2015-05-22: 60 mg via ORAL
  Filled 2015-05-22: qty 3

## 2015-05-22 MED ORDER — SODIUM CHLORIDE 0.9 % IV BOLUS (SEPSIS)
1000.0000 mL | Freq: Once | INTRAVENOUS | Status: AC
  Administered 2015-05-22: 1000 mL via INTRAVENOUS

## 2015-05-22 MED ORDER — POTASSIUM CHLORIDE CRYS ER 20 MEQ PO TBCR
40.0000 meq | EXTENDED_RELEASE_TABLET | Freq: Once | ORAL | Status: AC
  Administered 2015-05-22: 40 meq via ORAL
  Filled 2015-05-22: qty 2

## 2015-05-22 MED ORDER — IBUPROFEN 800 MG PO TABS
800.0000 mg | ORAL_TABLET | Freq: Once | ORAL | Status: AC
  Administered 2015-05-22: 800 mg via ORAL
  Filled 2015-05-22: qty 1

## 2015-05-22 NOTE — Discharge Instructions (Signed)
Take your medications as prescribed. I recommend continuing to take 600mg  Ibuprofen 4 times daily for fever/headache/body aches as needed. Continue drinking at least six 8 ounce glasses of water/gatorade daily to remain hydrated at home. You may also used a cool mist humidifier to help with your cough and congestion. Use your albuterol inhaler as prescribed as needed for wheezing and shortness of breath.  Please follow up with a primary care provider from the Resource Guide provided below in 3-4 days if your symptoms have not improved. Please return to the Emergency Department if symptoms worsen or new onset of worsening headache, neck stiffness, difficulty breathing, coughing up blood, chest pain, vomiting, unable to keep fluids down, diarrhea.

## 2015-05-22 NOTE — ED Provider Notes (Signed)
CSN: [id number]     Arrival date & time 05/22/15  1118 History   First MD Initiated Contact with Patient 05/22/15 1228     Chief Complaint  Patient presents with  . Influenza     (Consider location/radiation/quality/duration/timing/severity/associated sxs/prior Treatment) HPI   Pt is a 35 year old female past medical history of anxiety and depression who presents the ED with complaint of flulike symptoms, onset one week. Patient reports she has had a productive cough, sore throat, rhinorrhea, nasal congestion, bilateral ear pain, wheezing, shortness of breath, sinus pressure, headache, chills, body aches and abdominal pain with coughing. She notes she has been taking BC powder at home and endorses relief of her headache. Denies fever, facial/neck swelling, trismus, stridor, palpitations, nausea, vomiting, diarrhea, urinary symptoms, rash. She notes her husband has been sick with similar symptoms over the past week.   Past Medical History  Diagnosis Date  . Herniated lumbar disc without myelopathy   . Polysubstance abuse    History reviewed. No pertinent past surgical history. History reviewed. No pertinent family history. Social History  Substance Use Topics  . Smoking status: Current Every Day Smoker -- 1.00 packs/day    Types: Cigarettes  . Smokeless tobacco: None  . Alcohol Use: Yes   OB History    No data available     Review of Systems  Constitutional: Positive for chills.  HENT: Positive for congestion, ear pain, rhinorrhea, sinus pressure and sore throat.   Respiratory: Positive for cough, shortness of breath and wheezing.   Gastrointestinal: Positive for abdominal pain (with coughing).  Musculoskeletal: Positive for myalgias (generalized body aches).  Neurological: Positive for headaches.  All other systems reviewed and are negative.     Allergies  Doxycycline  Home Medications   Prior to Admission medications   Medication Sig Start Date End Date Taking?  Authorizing Provider  ALPRAZolam Prudy Feeler) 1 MG tablet Take 1 mg by mouth 3 (three) times daily as needed for anxiety.   Yes Historical Provider, MD  Aspirin-Acetaminophen-Caffeine (GOODY HEADACHE PO) Take 1 packet by mouth daily as needed (pain/headache).   Yes Historical Provider, MD  guaiFENesin (MUCINEX) 600 MG 12 hr tablet Take 600 mg by mouth 2 (two) times daily as needed for cough or to loosen phlegm.   Yes Historical Provider, MD  benzonatate (TESSALON) 100 MG capsule Take 2 capsules (200 mg total) by mouth 2 (two) times daily as needed for cough. 05/22/15   Barrett Henle, PA-C  oxymetazoline (AFRIN NASAL SPRAY) 0.05 % nasal spray Place 1 spray into both nostrils 2 (two) times daily. 05/22/15   Satira Sark Caide Campi, PA-C   BP 130/89 mmHg  Pulse 123  Temp(Src) 98.7 F (37.1 C) (Oral)  Resp 16  SpO2 94%  LMP 05/15/2015 Physical Exam  Constitutional: She is oriented to person, place, and time. She appears well-developed and well-nourished. No distress.  HENT:  Head: Normocephalic and atraumatic.  Right Ear: Tympanic membrane normal.  Left Ear: Tympanic membrane normal.  Nose: Nose normal. Right sinus exhibits no maxillary sinus tenderness and no frontal sinus tenderness. Left sinus exhibits no maxillary sinus tenderness and no frontal sinus tenderness.  Mouth/Throat: Uvula is midline and mucous membranes are normal. Posterior oropharyngeal erythema present. No oropharyngeal exudate, posterior oropharyngeal edema or tonsillar abscesses.  Eyes: Conjunctivae and EOM are normal. Pupils are equal, round, and reactive to light. Right eye exhibits no discharge. Left eye exhibits no discharge. No scleral icterus.  Neck: Normal range of motion. Neck supple.  Cardiovascular:  Normal rate, regular rhythm, normal heart sounds and intact distal pulses.   Pulmonary/Chest: Effort normal. No stridor. No respiratory distress. She has wheezes (diffuse expiratory wheezing noted bilaterally). She  has no rales. She exhibits no tenderness.  Abdominal: Soft. Bowel sounds are normal. She exhibits no distension and no mass. There is no tenderness. There is no rebound and no guarding.  Musculoskeletal: Normal range of motion. She exhibits no edema.  Lymphadenopathy:    She has no cervical adenopathy.  Neurological: She is alert and oriented to person, place, and time.  Skin: Skin is warm and dry. She is not diaphoretic.  Nursing note and vitals reviewed.   ED Course  Procedures (including critical care time) Labs Review Labs Reviewed  COMPREHENSIVE METABOLIC PANEL - Abnormal; Notable for the following:    Potassium 3.3 (*)    Glucose, Bld 158 (*)    BUN <5 (*)    Creatinine, Ser 1.16 (*)    Calcium 8.8 (*)    AST 114 (*)    GFR calc non Af Amer 59 (*)    All other components within normal limits  CBC - Abnormal; Notable for the following:    WBC 3.8 (*)    Platelets 134 (*)    All other components within normal limits  URINALYSIS, ROUTINE W REFLEX MICROSCOPIC (NOT AT [facility]) - Abnormal; Notable for the following:    Hgb urine dipstick SMALL (*)    Protein, ur 100 (*)    All other components within normal limits  URINE MICROSCOPIC-ADD ON - Abnormal; Notable for the following:    Squamous Epithelial / LPF 6-30 (*)    Bacteria, UA FEW (*)    Casts GRANULAR CAST (*)    All other components within normal limits  LIPASE, BLOOD  I-STAT BETA HCG BLOOD, ED ([facility], [facility], [facility] ONLY)    Imaging Review Dg Chest 2 View  05/22/2015  CLINICAL DATA:  Productive cough for 1 week with bilateral lower chest pain EXAM: CHEST  2 VIEW COMPARISON:  12/30/2012 FINDINGS: The heart size and mediastinal contours are within normal limits. Both lungs are clear. The visualized skeletal structures are unremarkable. IMPRESSION: No active cardiopulmonary disease. Electronically Signed   By: Esperanza Heir M.D.   On: 05/22/2015 12:00   I have personally reviewed and evaluated these images and lab results as part  of my medical decision-making.   EKG Interpretation None      MDM   Final diagnoses:  Influenza-like symptoms    Pt presents with flulike symptoms. Vitals are stable, low-grade fever.  No signs of dehydration, tolerating PO's.  Expiratory wheezing noted bilaterally. Abdominal exam benign. Pt given 2 neb treatments and streoids in the ED. On reevaluation, wheezing has significantly improved. Pt given IVF and ibuprofen. CXR negative. Potassium 3.3, pt given PO potassium in the ED. UA positive for small hgb, advised pt to have her urine rechecked at her PCP follow up appointment or return to the ED with sxs of fever, flank pain, visible hematuria. AST 114, due to abdominal exam benign and no abdominal tenderness while in the ED I do not feel that any further workup or imaging is warranted at this time. Advised pt to have her AST/ALT rechecked at her follow up appointment. Patient with symptoms consistent with influenza.  Discussed the cost versus benefit of Tamiflu treatment with the patient.  The patient understands that symptoms are greater than the recommended 24-48 hour window of treatment.  Patient will be discharged with  instructions to orally hydrate, rest, and use over-the-counter medications such as anti-inflammatories ibuprofen and Aleve for muscle aches and Tylenol for fever.  Patient will also be given a cough suppressant.  Patient given resources to follow up with PCP.    Satira Sarkicole Elizabeth ChicoraNadeau, [place] JerseyPA-C 05/22/15 1505  Vanetta MuldersScott Zackowski, MD 05/27/15 1001

## 2015-05-22 NOTE — ED Notes (Signed)
Pt reports flu like symptoms and vomiting x 1 week.

## 2016-04-05 ENCOUNTER — Encounter (HOSPITAL_COMMUNITY): Payer: Self-pay | Admitting: Emergency Medicine

## 2016-04-05 ENCOUNTER — Emergency Department (HOSPITAL_COMMUNITY): Payer: Self-pay

## 2016-04-05 ENCOUNTER — Emergency Department (HOSPITAL_COMMUNITY)
Admission: EM | Admit: 2016-04-05 | Discharge: 2016-04-05 | Disposition: A | Discharge status: Home / Self Care | Payer: Self-pay | Attending: Physician Assistant | Admitting: Physician Assistant

## 2016-04-05 DIAGNOSIS — R0789 Other chest pain: Secondary | ICD-10-CM | POA: Insufficient documentation

## 2016-04-05 DIAGNOSIS — R0602 Shortness of breath: Secondary | ICD-10-CM

## 2016-04-05 DIAGNOSIS — I10 Essential (primary) hypertension: Secondary | ICD-10-CM | POA: Insufficient documentation

## 2016-04-05 DIAGNOSIS — Z720 Tobacco use: Secondary | ICD-10-CM

## 2016-04-05 DIAGNOSIS — N179 Acute kidney failure, unspecified: Secondary | ICD-10-CM | POA: Insufficient documentation

## 2016-04-05 DIAGNOSIS — F17210 Nicotine dependence, cigarettes, uncomplicated: Secondary | ICD-10-CM | POA: Insufficient documentation

## 2016-04-05 LAB — BASIC METABOLIC PANEL
Anion gap: 9 (ref 5–15)
BUN: 16 mg/dL (ref 6–20)
CALCIUM: 9.8 mg/dL (ref 8.9–10.3)
CO2: 23 mmol/L (ref 22–32)
Chloride: 104 mmol/L (ref 101–111)
Creatinine, Ser: 1.21 mg/dL — ABNORMAL HIGH (ref 0.44–1.00)
GFR calc Af Amer: >60 mL/min (ref >60)
GFR calc non Af Amer: 56 mL/min — ABNORMAL LOW (ref >60)
Glucose, Bld: 98 mg/dL (ref 65–99)
Potassium: 4 mmol/L (ref 3.5–5.1)
Sodium: 136 mmol/L (ref 135–145)

## 2016-04-05 LAB — HEPATIC FUNCTION PANEL
ALBUMIN: 5 g/dL (ref 3.5–5.0)
ALK PHOS: 62 U/L (ref 38–126)
ALT: 12 U/L — AB (ref 14–54)
AST: 21 U/L (ref 15–41)
Bilirubin, Direct: 0.1 mg/dL (ref 0.1–0.5)
Indirect Bilirubin: 0.5 mg/dL (ref 0.3–0.9)
TOTAL PROTEIN: 8.6 g/dL — AB (ref 6.5–8.1)
Total Bilirubin: 0.6 mg/dL (ref 0.3–1.2)

## 2016-04-05 LAB — URINALYSIS, ROUTINE W REFLEX MICROSCOPIC
Bilirubin Urine: NEGATIVE
GLUCOSE, UA: NEGATIVE mg/dL
KETONES UR: NEGATIVE mg/dL
LEUKOCYTES UA: NEGATIVE
Nitrite: NEGATIVE
PROTEIN: NEGATIVE mg/dL
Specific Gravity, Urine: 1.004 — ABNORMAL LOW (ref 1.005–1.030)
pH: 6 (ref 5.0–8.0)

## 2016-04-05 LAB — CBC
HCT: 43.3 % (ref 36.0–46.0)
HEMOGLOBIN: 14.4 g/dL (ref 12.0–15.0)
MCH: 28.5 pg (ref 26.0–34.0)
MCHC: 33.3 g/dL (ref 30.0–36.0)
MCV: 85.7 fL (ref 78.0–100.0)
Platelets: 274 10*3/uL (ref 150–400)
RBC: 5.05 MIL/uL (ref 3.87–5.11)
RDW: 13.6 % (ref 11.5–15.5)
WBC: 14.1 10*3/uL — ABNORMAL HIGH (ref 4.0–10.5)

## 2016-04-05 LAB — I-STAT TROPONIN, ED
TROPONIN I, POC: 0 ng/mL (ref 0.00–0.08)
Troponin i, poc: 0 ng/mL (ref 0.00–0.08)

## 2016-04-05 LAB — POC URINE PREG, ED: Preg Test, Ur: NEGATIVE

## 2016-04-05 LAB — D-DIMER, QUANTITATIVE: D-Dimer, Quant: 1.44 ug/mL-FEU — ABNORMAL HIGH (ref 0.00–0.50)

## 2016-04-05 MED ORDER — SODIUM CHLORIDE 0.9 % IV BOLUS (SEPSIS)
1000.0000 mL | Freq: Once | INTRAVENOUS | Status: AC
Start: 2016-04-05 — End: 2016-04-05
  Administered 2016-04-05: 1000 mL via INTRAVENOUS

## 2016-04-05 MED ORDER — IOPAMIDOL (ISOVUE-370) INJECTION 76%
INTRAVENOUS | Status: AC
  Filled 2016-04-05: qty 100

## 2016-04-05 MED ORDER — IOPAMIDOL (ISOVUE-300) INJECTION 61%
100.0000 mL | Freq: Once | INTRAVENOUS | Status: DC | PRN
Start: 2016-04-05 — End: 2016-04-06

## 2016-04-05 MED ORDER — SODIUM CHLORIDE 0.9 % IJ SOLN
INTRAMUSCULAR | Status: AC
  Filled 2016-04-05: qty 50

## 2016-04-05 MED ORDER — IOPAMIDOL (ISOVUE-370) INJECTION 76%
100.0000 mL | Freq: Once | INTRAVENOUS | Status: AC | PRN
  Administered 2016-04-05: 100 mL via INTRAVENOUS

## 2016-04-05 MED ORDER — ASPIRIN 81 MG PO CHEW
324.0000 mg | CHEWABLE_TABLET | Freq: Once | ORAL | Status: AC
  Administered 2016-04-05: 324 mg via ORAL
  Filled 2016-04-05: qty 4

## 2016-04-05 MED ORDER — GI COCKTAIL ~~LOC~~
30.0000 mL | Freq: Once | ORAL | Status: AC
  Administered 2016-04-05: 30 mL via ORAL
  Filled 2016-04-05: qty 30

## 2016-04-05 MED ORDER — MORPHINE SULFATE (PF) 4 MG/ML IV SOLN
4.0000 mg | Freq: Once | INTRAVENOUS | Status: AC
  Administered 2016-04-05: 4 mg via INTRAVENOUS
  Filled 2016-04-05: qty 1

## 2016-04-05 NOTE — ED Notes (Signed)
Patient transported to CT 

## 2016-04-05 NOTE — ED Notes (Signed)
Patient ambulatory to restroom  ?

## 2016-04-05 NOTE — ED Triage Notes (Signed)
Patient reports shortness of breath, right sided chest pain, and cough pain x4 days.

## 2016-04-05 NOTE — ED Notes (Signed)
PT DISCHARGED. INSTRUCTIONS GIVEN. AAOX4. PT IN NO APPARENT DISTRESS. THE OPPORTUNITY TO ASK QUESTIONS WAS PROVIDED. 

## 2016-04-05 NOTE — Discharge Instructions (Signed)
Your labs, xray, CT scan, and EKG are all reassuring, your chest pain could be a variety of things including gas pain, muscle pain, and other nonemergent issues. Alternate between tylenol and ibuprofen as needed for pain. Stay well hydrated. You may consider using heat to the areas of pain, no more than 20 minutes every hour. STOP SMOKING! Your blood pressure was elevated today, which could be related to pain but could also indicate that you need medications; you will need to follow up with the [place] [facility] in the next 1-2 weeks to establish care, and for ongoing management of this issue. Eat a low salt diet to help with this as well. Follow up with the [place] [facility] in 1 week for recheck of symptoms and to establish medical care. Return to the ER for changes or worsening symptoms.  SEEK IMMEDIATE MEDICAL ATTENTION IF: You develop a fever.  Your chest pains become severe or intolerable.  You develop new, unexplained symptoms (problems).  You develop shortness of breath, nausea, vomiting, sweating or feel light headed.  You develop a new cough or you cough up blood. You develop new leg swelling

## 2016-04-05 NOTE — ED Provider Notes (Signed)
[facility]-EMERGENCY DEPT Provider Note   CSN: [id number] Arrival date & time: 04/05/16  1423     History   Chief Complaint Chief Complaint  Patient presents with  . Shortness of Breath  . Chest Pain    HPI [name] is a 36 y.o. female with a PMHx of polysubstance abuse and chronic back pain, and remote hx of HTN diagnosis not on any meds currently, who presents to the ED with complaints of right-sided chest pain began 4 days ago. Patient describes the pain as 10/10 constant sharp right lateral chest pain that radiates to her right upper back, worse with breathing and coughing, and mildly improved with Tylenol and hydrocodone. Associated symptoms include shortness of breath. Patient states that she has a chronic unchanged cough due to smoking, states that it's productive of sputum but she is not aware of the color, states that her cough has not changed recently and is the same as it always has been.  She denies diaphoresis, lightheadedness, fevers, chills, change in cough, sore throat, rhinorrhea, hemoptysis, wheezing, LE swelling, recent travel/surgery/immobilization, estrogen use, personal/family hx of DVT/PE, abd pain, N/V/D/C, hematuria, dysuria, myalgias, arthralgias, claudication, orthopnea, numbness, tingling, weakness, or any other complaints at this time. +Smoker. +FHx of cardiac disease, MI in both parents. Denies having used IV drugs recently. No PCP at this time. No other medical problems to her knowledge although she admits she hasn't seen a PCP in quite some time.   The history is provided by the patient and medical records. No language interpreter was used.  Shortness of Breath  Associated symptoms include cough (chronic, unchanged), sputum production (unknown color, unchanged from baseline) and chest pain. Pertinent negatives include no fever, no rhinorrhea, no sore throat, no hemoptysis, no wheezing, no orthopnea, no vomiting, no abdominal pain, no leg swelling and no  claudication. Associated medical issues do not include PE or DVT.  Chest Pain   This is a new problem. The current episode started more than 2 days ago. The problem occurs constantly. The problem has not changed since onset.The pain is associated with rest. The pain is present in the lateral region. The pain is at a severity of 10/10. The pain is moderate. The quality of the pain is described as sharp and pleuritic. The pain radiates to the upper back. Duration of episode(s) is 4 days. The symptoms are aggravated by deep breathing. Associated symptoms include cough (chronic, unchanged), shortness of breath and sputum production (unknown color, unchanged from baseline). Pertinent negatives include no abdominal pain, no claudication, no diaphoresis, no fever, no hemoptysis, no lower extremity edema, no nausea, no numbness, no orthopnea, no vomiting and no weakness. Treatments tried: tylenol and hydrocodone. The treatment provided moderate relief. Risk factors include smoking/tobacco exposure.  Her past medical history is significant for hypertension.  Pertinent negatives for past medical history include no diabetes, no DVT, no hyperlipidemia and no PE.  Her family medical history is significant for CAD.    Past Medical History:  Diagnosis Date  . Herniated lumbar disc without myelopathy   . Polysubstance abuse     There are no active problems to display for this patient.   History reviewed. No pertinent surgical history.  OB History    No data available       Home Medications    Prior to Admission medications   Medication Sig Start Date End Date Taking? Authorizing Provider  ALPRAZolam Prudy Feeler) 1 MG tablet Take 1 mg by mouth 3 (three) times daily  as needed for anxiety.    Historical Provider, MD  Aspirin-Acetaminophen-Caffeine (GOODY HEADACHE PO) Take 1 packet by mouth daily as needed (pain/headache).    Historical Provider, MD  benzonatate (TESSALON) 100 MG capsule Take 2 capsules (200  mg total) by mouth 2 (two) times daily as needed for cough. 05/22/15   Barrett Henle, PA-C  guaiFENesin (MUCINEX) 600 MG 12 hr tablet Take 600 mg by mouth 2 (two) times daily as needed for cough or to loosen phlegm.    Historical Provider, MD  oxymetazoline (AFRIN NASAL SPRAY) 0.05 % nasal spray Place 1 spray into both nostrils 2 (two) times daily. 05/22/15   Barrett Henle, PA-C    Family History No family history on file.  Social History Social History  Substance Use Topics  . Smoking status: Current Every Day Smoker    Packs/day: 1.00    Types: Cigarettes  . Smokeless tobacco: Never Used  . Alcohol use Yes     Allergies   Doxycycline   Review of Systems Review of Systems  Constitutional: Negative for chills, diaphoresis and fever.  HENT: Negative for rhinorrhea and sore throat.   Respiratory: Positive for cough (chronic, unchanged), sputum production (unknown color, unchanged from baseline) and shortness of breath. Negative for hemoptysis and wheezing.   Cardiovascular: Positive for chest pain. Negative for orthopnea, claudication and leg swelling.  Gastrointestinal: Negative for abdominal pain, constipation, diarrhea, nausea and vomiting.  Genitourinary: Negative for dysuria and hematuria.  Musculoskeletal: Negative for arthralgias and myalgias.  Skin: Negative for color change.  Allergic/Immunologic: Negative for immunocompromised state.  Neurological: Negative for weakness, light-headedness and numbness.  Psychiatric/Behavioral: Negative for confusion.   10 Systems reviewed and are negative for acute change except as noted in the HPI.   Physical Exam Updated Vital Signs BP (!) 176/123 (BP Location: Right Arm)   Pulse 98   Temp 98.4 F (36.9 C) (Oral)   Resp 17   Ht 5\' 1"  (1.549 m)   Wt 61.2 kg   LMP 03/22/2016   SpO2 100%   BMI 25.51 kg/m   Physical Exam  Constitutional: She is oriented to person, place, and time. Vital signs are normal.  She appears well-developed and well-nourished.  Non-toxic appearance. No distress.  Afebrile, nontoxic, NAD  HENT:  Head: Normocephalic and atraumatic.  Mouth/Throat: Oropharynx is clear and moist and mucous membranes are normal.  Eyes: Conjunctivae and EOM are normal. Right eye exhibits no discharge. Left eye exhibits no discharge.  Neck: Normal range of motion. Neck supple.  Cardiovascular: Normal rate, regular rhythm, normal heart sounds and intact distal pulses.  Exam reveals no gallop and no friction rub.   No murmur heard. RRR, nl s1/s2, no m/r/g, distal pulses intact, no pedal edema   Pulmonary/Chest: Effort normal and breath sounds normal. No respiratory distress. She has no decreased breath sounds. She has no wheezes. She has no rhonchi. She has no rales.     She exhibits tenderness. She exhibits no crepitus, no deformity and no retraction.    CTAB in all lung fields, no w/r/r, no hypoxia or increased WOB, speaking in full sentences, SpO2 100% on RA Chest wall with mild R lateral and posterior TTP without crepitus, deformities, or retractions   Abdominal: Soft. Normal appearance and bowel sounds are normal. She exhibits no distension. There is no tenderness. There is CVA tenderness (R midthoracic back/CVA area tenderness). There is no rigidity, no rebound, no guarding, no tenderness at McBurney's point and negative Murphy's sign.  Soft, NTND, +BS throughout, no r/g/r, neg murphy's, neg mcburney's, but with mild R sided mid-thoracic back/CVA area TTP   Musculoskeletal: Normal range of motion.  MAE x4 Strength and sensation grossly intact in all extremities Distal pulses intact Gait steady No pedal edema, neg homan's bilaterally   Neurological: She is alert and oriented to person, place, and time. She has normal strength. No sensory deficit.  Skin: Skin is warm, dry and intact. No rash noted.  Psychiatric: She has a normal mood and affect.  Nursing note and vitals  reviewed.    ED Treatments / Results  Labs (all labs ordered are listed, but only abnormal results are displayed) Labs Reviewed  BASIC METABOLIC PANEL - Abnormal; Notable for the following:       Result Value   Creatinine, Ser 1.21 (*)    GFR calc non Af Amer 56 (*)    All other components within normal limits  CBC - Abnormal; Notable for the following:    WBC 14.1 (*)    All other components within normal limits  D-DIMER, QUANTITATIVE (NOT AT [facility]) - Abnormal; Notable for the following:    D-Dimer, Quant 1.44 (*)    All other components within normal limits  URINALYSIS, ROUTINE W REFLEX MICROSCOPIC - Abnormal; Notable for the following:    Color, Urine STRAW (*)    Specific Gravity, Urine 1.004 (*)    Hgb urine dipstick MODERATE (*)    Bacteria, UA RARE (*)    Squamous Epithelial / LPF 0-5 (*)    All other components within normal limits  HEPATIC FUNCTION PANEL - Abnormal; Notable for the following:    Total Protein 8.6 (*)    ALT 12 (*)    All other components within normal limits  I-STAT TROPOININ, ED  I-STAT TROPOININ, ED  POC URINE PREG, ED    EKG  EKG Interpretation  Date/Time:  Tuesday April 05 2016 14:46:37 EST Ventricular Rate:  92 PR Interval:    QRS Duration: 83 QT Interval:  346 QTC Calculation: 428 R Axis:   54 Text Interpretation:  Sinus rhythm Probable left atrial enlargement Anterior infarct, old No significant change since last tracing Confirmed by Kandis Mannan ([id number]) on 04/05/2016 8:17:05 PM       Radiology Dg Chest 2 View  Result Date: 04/05/2016 CLINICAL DATA:  Chest pain for 4 days.  Shortness of breath today. EXAM: CHEST  2 VIEW COMPARISON:  05/22/2015 FINDINGS: Heart and mediastinal contours are within normal limits. No focal opacities or effusions. No acute bony abnormality. IMPRESSION: No active cardiopulmonary disease. Electronically Signed   By: Charlett Nose M.D.   On: 04/05/2016 14:57   Ct Angio Chest Pe W Or Wo  Contrast  Result Date: 04/05/2016 CLINICAL DATA:  Shortness of breath with right-sided chest pain and cough EXAM: CT ANGIOGRAPHY CHEST WITH CONTRAST TECHNIQUE: Multidetector CT imaging of the chest was performed using the standard protocol during bolus administration of intravenous contrast. Multiplanar CT image reconstructions and MIPs were obtained to evaluate the vascular anatomy. CONTRAST:  100 mL Isovue 370 intravenous COMPARISON:  Chest x-ray 04/05/2016 FINDINGS: Cardiovascular: Satisfactory opacification of the pulmonary arteries to the segmental level. No evidence of pulmonary embolism. Normal heart size. No pericardial effusion. Non aneurysmal aorta. No dissection. Mediastinum/Nodes: No enlarged mediastinal, hilar, or axillary lymph nodes. Subcentimeter mediastinal lymph nodes. Thyroid gland, trachea, and esophagus demonstrate no significant findings. Lungs/Pleura: Mild emphysematous disease is present primarily within the upper lobes. Linear atelectasis or scar in the  right middle lobe. There is a tiny right pleural effusion. Upper Abdomen: No acute abnormality. Musculoskeletal: No chest wall abnormality. No acute or significant osseous findings. Review of the MIP images confirms the above findings. IMPRESSION: 1. No CT evidence for acute pulmonary embolus or aortic dissection 2. Tiny right pleural effusion. 3. Mild emphysematous disease Electronically Signed   By: Jasmine Pang M.D.   On: 04/05/2016 21:34    Procedures Procedures (including critical care time)  Medications Ordered in ED Medications  iopamidol (ISOVUE-300) 61 % injection 100 mL (not administered)  sodium chloride 0.9 % injection (not administered)  iopamidol (ISOVUE-370) 76 % injection (not administered)  aspirin chewable tablet 324 mg (324 mg Oral Given 04/05/16 1942)  sodium chloride 0.9 % bolus 1,000 mL (0 mLs Intravenous Stopped 04/05/16 2117)  gi cocktail (Maalox,Lidocaine,Donnatal) (30 mLs Oral Given 04/05/16 1942)  morphine  4 MG/ML injection 4 mg (4 mg Intravenous Given 04/05/16 2118)  iopamidol (ISOVUE-370) 76 % injection 100 mL (100 mLs Intravenous Contrast Given 04/05/16 2106)     Initial Impression / Assessment and Plan / ED Course  I have reviewed the triage vital signs and the nursing notes.  Pertinent labs & imaging results that were available during my care of the patient were reviewed by me and considered in my medical decision making (see chart for details).     36 y.o. female here with R CP and SOB x4 days, pleuritic in nature. Reports chronic cough due to smoking, unchanged from baseline. On exam, reproducible tenderness to R lateral and posterior chest wall. No tachycardia or hypoxia, clear lungs, no LE swelling, PERC neg on exam however pleuritic nature of pain makes me concerned for possibility of PE, although low-risk; will get D-dimer. Trop neg, will repeat since it's been 3hrs; BMP with mildly bumped Cr 1.21 similar to prior values, will give fluids; CBC with mildly elevated WBC 14.1 of unclear significance. CXR neg. EKG unchanged from prior. No abdominal tenderness or murphy's exam, but given R flank area tenderness, will add-on LFTs and U/A with Upreg, in addition to the second trop and D-dimer. Will give ASA, GI cocktail, and fluids, then reassess shortly  8:49 PM Second trop negative. D-dimer elevated 1.44, will proceed with CTA to r/o PE. Pt still in pain, will give low-dose morphine since we can't give toradol given her Cr bump. U/A contaminated, but otherwise unremarkable without evidence of infection. Upreg neg. LFTs unremarkable. Will reassess after CTA  10:00 PM Pt feeling slightly better. CTA with trace R pleural effusion, unclear significance; mild emphysematous disease, but negative for PE/dissection/etc. CP likely musculoskeletal. Pt feeling better. Advised tylenol/motrin/heat use, smoking cessation, and f/up with [facility] in 1wk for recheck of symptoms and to establish care. Of note, HTN  noted, similar to prior visits, and could be pain related, advised that she needs to f/up with [facility] for ongoing management of this but will hold off on starting anything today since it could be pain mediated. DASH diet advised. I explained the diagnosis and have given explicit precautions to return to the ER including for any other new or worsening symptoms. The patient understands and accepts the medical plan as it's been dictated and I have answered their questions. Discharge instructions concerning home care and prescriptions have been given. The patient is STABLE and is discharged to home in good condition.   Final Clinical Impressions(s) / ED Diagnoses   Final diagnoses:  Atypical chest pain  Chest wall pain  Shortness of breath  Hypertension, unspecified type  AKI (acute kidney injury) (HCC)  Tobacco user    New Prescriptions New Prescriptions   No medications on file     [id number] Loyal Ande Therrell, PA-C 04/05/16 2200    Courteney Randall AnLyn Mackuen, MD 04/06/16 2225

## 2018-04-23 ENCOUNTER — Encounter (HOSPITAL_COMMUNITY): Payer: Self-pay

## 2018-04-23 ENCOUNTER — Other Ambulatory Visit: Payer: Self-pay

## 2018-04-23 ENCOUNTER — Emergency Department (HOSPITAL_COMMUNITY)
Admission: EM | Admit: 2018-04-23 | Discharge: 2018-04-23 | Disposition: A | Discharge status: Home / Self Care | Payer: Self-pay | Attending: Emergency Medicine | Admitting: Emergency Medicine

## 2018-04-23 DIAGNOSIS — Z79899 Other long term (current) drug therapy: Secondary | ICD-10-CM | POA: Insufficient documentation

## 2018-04-23 DIAGNOSIS — R059 Cough, unspecified: Secondary | ICD-10-CM

## 2018-04-23 DIAGNOSIS — R05 Cough: Secondary | ICD-10-CM | POA: Insufficient documentation

## 2018-04-23 DIAGNOSIS — M25512 Pain in left shoulder: Secondary | ICD-10-CM | POA: Insufficient documentation

## 2018-04-23 DIAGNOSIS — F17210 Nicotine dependence, cigarettes, uncomplicated: Secondary | ICD-10-CM | POA: Insufficient documentation

## 2018-04-23 MED ORDER — NAPROXEN 500 MG PO TABS: 500.0000 mg | ORAL_TABLET | Freq: Two times a day (BID) | ORAL | 0 refills | Status: AC

## 2018-04-23 MED ORDER — KETOROLAC TROMETHAMINE 30 MG/ML IJ SOLN
30.0000 mg | Freq: Once | INTRAMUSCULAR | Status: AC
  Administered 2018-04-23: 30 mg via INTRAMUSCULAR
  Filled 2018-04-23: qty 1

## 2018-04-23 MED ORDER — METHOCARBAMOL 500 MG PO TABS: 500.0000 mg | ORAL_TABLET | Freq: Two times a day (BID) | ORAL | 0 refills | Status: AC

## 2018-04-23 NOTE — ED Provider Notes (Signed)
[facility]-EMERGENCY DEPT Provider Note   CSN: [id number] Arrival date & time: 04/23/18  0962    History   Chief Complaint Chief Complaint  Patient presents with  . Shoulder Pain  . Cough    HPI [name] is a 38 y.o. RHD female who presents with left shoulder pain and a cough. PMH significant for hx of lumbar herniated disc and polysubstance abuse (THC, benzos), tobacco abuse. She states that on Feb 2nd she was playing cards and turned her neck and had an acute onset of L anterior shoulder cramping. It is constant and radiates throughout her shoulder and sometimes down her arm. Movement of her shoulder makes it better. Nothing makes it worse - the pain is constant. It's a sharp stabbing pain which feels like an "arrow". She has tried Tylenol and Flexeril without relief. She has been able to work - she works at [employer], but it hurts.  Additionally she is here for a cough. She thinks she has a cold. Her significant other has had a cold. She has had some congestion. She denies fever, chills, chest pain, SOB.     HPI  Past Medical History:  Diagnosis Date  . Herniated lumbar disc without myelopathy   . Polysubstance abuse (HCC)     There are no active problems to display for this patient.   History reviewed. No pertinent surgical history.   OB History   No obstetric history on file.      Home Medications    Prior to Admission medications   Medication Sig Start Date End Date Taking? Authorizing Provider  Aspirin-Salicylamide-Caffeine (BC HEADACHE POWDER PO) Take 1 Package by mouth 3 times/day as needed-between meals & bedtime (headache).   Yes [provider]    Family History No family history on file.  Social History Social History   Tobacco Use  . Smoking status: Current Every Day Smoker    Packs/day: 1.00    Types: Cigarettes  . Smokeless tobacco: Never Used  Substance Use Topics  . Alcohol use: Yes  .  Drug use: Yes    Types: Marijuana     Allergies   Doxycycline   Review of Systems Review of Systems  Constitutional: Negative for fever.  HENT: Positive for congestion.   Respiratory: Positive for cough. Negative for shortness of breath.   Cardiovascular: Negative for chest pain.  Musculoskeletal: Positive for arthralgias and myalgias.  Neurological: Negative for weakness and numbness.  All other systems reviewed and are negative.    Physical Exam Updated Vital Signs BP (!) 170/114 (BP Location: Right Arm)   Pulse 96   Temp 98.2 F (36.8 C) (Oral)   Resp 16   Ht 5\' 1"  (1.549 m)   Wt 63.5 kg   LMP 12/21/2017   SpO2 100%   BMI 26.45 kg/m   Physical Exam Vitals signs and nursing note reviewed.  Constitutional:      General: She is not in acute distress.    Appearance: Normal appearance. She is well-developed.     Comments: Calm and cooperative  HENT:     Head: Normocephalic and atraumatic.     Right Ear: Tympanic membrane normal.     Left Ear: Tympanic membrane normal.     Nose: Nose normal.     Mouth/Throat:     Lips: Pink.     Mouth: Mucous membranes are moist.     Pharynx: Oropharynx is clear.  Eyes:     General:  No scleral icterus.       Right eye: No discharge.        Left eye: No discharge.     Conjunctiva/sclera: Conjunctivae normal.     Pupils: Pupils are equal, round, and reactive to light.  Neck:     Musculoskeletal: Normal range of motion.  Cardiovascular:     Rate and Rhythm: Normal rate and regular rhythm.  Pulmonary:     Effort: Pulmonary effort is normal. No respiratory distress.     Breath sounds: Normal breath sounds.  Abdominal:     General: There is no distension.  Musculoskeletal:     Comments: Left shoulder: No obvious swelling, deformity, or warmth. Mild tenderness of the trapezius, she states it feels better when shoulder area is palpated. FROM. 5/5 strength. N/V intact.   Skin:    General: Skin is warm and dry.  Neurological:       Mental Status: She is alert and oriented to person, place, and time.  Psychiatric:        Behavior: Behavior normal.      ED Treatments / Results  Labs (all labs ordered are listed, but only abnormal results are displayed) Labs Reviewed - No data to display  EKG None  Radiology No results found.  Procedures Procedures (including critical care time)  Medications Ordered in ED Medications - No data to display   Initial Impression / Assessment and Plan / ED Course  I have reviewed the triage vital signs and the nursing notes.  Pertinent labs & imaging results that were available during my care of the patient were reviewed by me and considered in my medical decision making (see chart for details).  38 year old female with L shoulder pain for one month and a cough. She is hypertensive but otherwise vitals are normal. On exam heart is regular rate and rhythm. Lungs are CTA. She has FROM of her shoulder with normal strength and intact distal pulses. Will defer imaging since pain sounds muscular. She has tried Tylenol and Flexeril. Will rx NSAIDs and Robaxin. She was given IM Toradol here. She was encouraged to f/u with PCP because of her BP and she may need physical therapy. Her cough is consistent with viral illness. Advised return if worsening.  Final Clinical Impressions(s) / ED Diagnoses   Final diagnoses:  Acute pain of left shoulder  Cough    ED Discharge Orders    None       Bethel Born, PA-C 04/23/18 [id number]    Alvira Monday, MD 04/24/18 [phone]

## 2018-04-23 NOTE — Discharge Instructions (Addendum)
Take Naproxen for pain Take Robaxin for muscle pain Apply heat and try to massage the area Try gentle stretching Follow up with a primary doctor - you can make an appointment at [facility]

## 2018-04-23 NOTE — ED Triage Notes (Signed)
Pt states left shoulder pain "since the super bowl". Pt states she was playing cards when it cramped up.  Pt states cough since yesterday as well.
